# Patient Record
Sex: Female | Born: 1985
Health system: Southern US, Community
[De-identification: ages and names within clinical notes are randomized; demographics above are authoritative.]

## PROBLEM LIST (undated history)

## (undated) ENCOUNTER — Inpatient Hospital Stay (HOSPITAL_COMMUNITY): Payer: Self-pay

## (undated) DIAGNOSIS — A749 Chlamydial infection, unspecified: Secondary | ICD-10-CM

## (undated) DIAGNOSIS — Z9889 Other specified postprocedural states: Secondary | ICD-10-CM

## (undated) DIAGNOSIS — B373 Candidiasis of vulva and vagina: Secondary | ICD-10-CM

## (undated) DIAGNOSIS — N760 Acute vaginitis: Secondary | ICD-10-CM

## (undated) DIAGNOSIS — N83209 Unspecified ovarian cyst, unspecified side: Secondary | ICD-10-CM

## (undated) DIAGNOSIS — B9689 Other specified bacterial agents as the cause of diseases classified elsewhere: Secondary | ICD-10-CM

## (undated) DIAGNOSIS — B3731 Acute candidiasis of vulva and vagina: Secondary | ICD-10-CM

## (undated) DIAGNOSIS — N730 Acute parametritis and pelvic cellulitis: Secondary | ICD-10-CM

## (undated) DIAGNOSIS — R112 Nausea with vomiting, unspecified: Secondary | ICD-10-CM

## (undated) DIAGNOSIS — J45909 Unspecified asthma, uncomplicated: Secondary | ICD-10-CM

## (undated) HISTORY — DX: Unspecified asthma, uncomplicated: J45.909

## (undated) HISTORY — PX: WISDOM TOOTH EXTRACTION: SHX21

---
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
## 2005-10-16 ENCOUNTER — Emergency Department (HOSPITAL_COMMUNITY): Admission: EM | Admit: 2005-10-16 | Discharge: 2005-10-16 | Payer: Self-pay | Admitting: Emergency Medicine

## 2006-01-12 ENCOUNTER — Ambulatory Visit: Payer: Self-pay | Admitting: Obstetrics and Gynecology

## 2006-01-12 ENCOUNTER — Inpatient Hospital Stay (HOSPITAL_COMMUNITY): Admission: AD | Admit: 2006-01-12 | Discharge: 2006-01-12 | Payer: Self-pay | Admitting: Obstetrics and Gynecology

## 2006-03-03 ENCOUNTER — Inpatient Hospital Stay (HOSPITAL_COMMUNITY): Admission: AD | Admit: 2006-03-03 | Discharge: 2006-03-03 | Payer: Self-pay | Admitting: Obstetrics and Gynecology

## 2006-03-18 ENCOUNTER — Ambulatory Visit: Payer: Self-pay | Admitting: Obstetrics & Gynecology

## 2006-03-18 ENCOUNTER — Inpatient Hospital Stay (HOSPITAL_COMMUNITY): Admission: AD | Admit: 2006-03-18 | Discharge: 2006-03-18 | Payer: Self-pay | Admitting: Gynecology

## 2006-03-25 ENCOUNTER — Ambulatory Visit: Payer: Self-pay | Admitting: Obstetrics & Gynecology

## 2006-04-15 ENCOUNTER — Ambulatory Visit: Payer: Self-pay | Admitting: Obstetrics & Gynecology

## 2006-04-29 ENCOUNTER — Ambulatory Visit: Payer: Self-pay | Admitting: Obstetrics & Gynecology

## 2006-05-06 ENCOUNTER — Ambulatory Visit: Payer: Self-pay | Admitting: Obstetrics & Gynecology

## 2006-05-07 ENCOUNTER — Inpatient Hospital Stay (HOSPITAL_COMMUNITY): Admission: AD | Admit: 2006-05-07 | Discharge: 2006-05-07 | Payer: Self-pay | Admitting: Obstetrics and Gynecology

## 2006-05-13 ENCOUNTER — Ambulatory Visit: Payer: Self-pay | Admitting: Obstetrics & Gynecology

## 2006-05-20 ENCOUNTER — Ambulatory Visit: Payer: Self-pay | Admitting: Family Medicine

## 2006-06-03 ENCOUNTER — Ambulatory Visit: Payer: Self-pay | Admitting: *Deleted

## 2006-06-10 ENCOUNTER — Ambulatory Visit: Payer: Self-pay | Admitting: Obstetrics and Gynecology

## 2006-06-10 ENCOUNTER — Inpatient Hospital Stay (HOSPITAL_COMMUNITY): Admission: AD | Admit: 2006-06-10 | Discharge: 2006-06-13 | Payer: Self-pay | Admitting: Gynecology

## 2006-06-10 ENCOUNTER — Ambulatory Visit: Payer: Self-pay | Admitting: Obstetrics & Gynecology

## 2006-09-02 ENCOUNTER — Ambulatory Visit: Payer: Self-pay | Admitting: Obstetrics and Gynecology

## 2007-09-14 ENCOUNTER — Emergency Department (HOSPITAL_COMMUNITY): Admission: EM | Admit: 2007-09-14 | Discharge: 2007-09-14 | Payer: Self-pay | Admitting: Emergency Medicine

## 2007-11-04 ENCOUNTER — Emergency Department (HOSPITAL_COMMUNITY): Admission: EM | Admit: 2007-11-04 | Discharge: 2007-11-04 | Payer: Self-pay | Admitting: Emergency Medicine

## 2007-11-19 IMAGING — US US OB COMP LESS 14 WK
1 series · 14 of 28 positions shown · non-contrast
Comparison: none

CLINICAL DATA: Abdominal pain, positive pregnancy test.
 OBSTETRICAL ULTRASOUND <14 WKS AND TRANSVAGINAL OB US:
TECHNIQUE: Both transabdominal and transvaginal ultrasound examinations were performed for complete evaluation of the gestation as well as the maternal uterus, adnexal regions, and pelvic cul-de-sac.

[Series 1: unknown · 0.27mm/px · 14 of 63 slices shown]
[im 3/63]
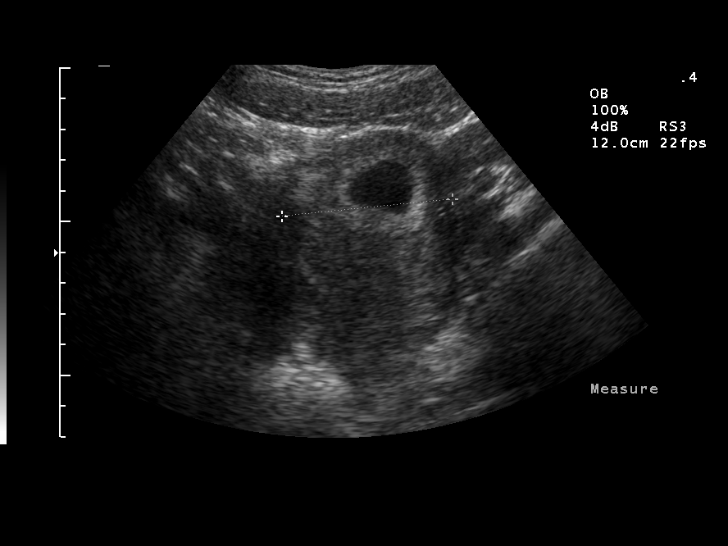
[im 7/63]
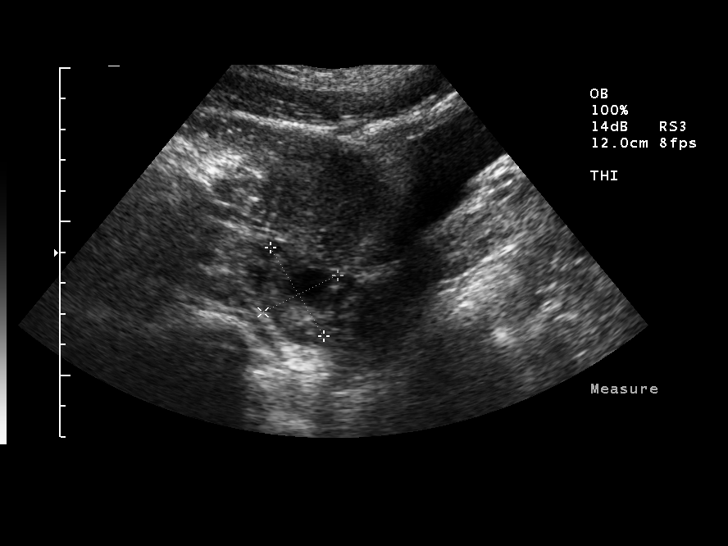
[im 12/63]
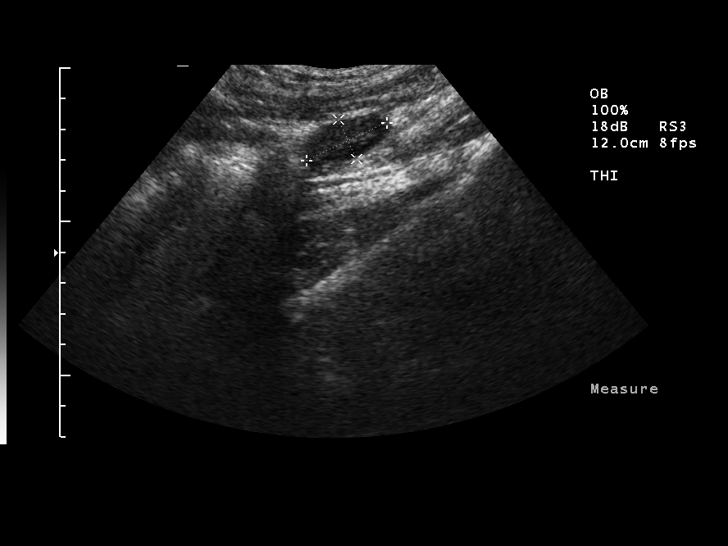
[im 17/63]
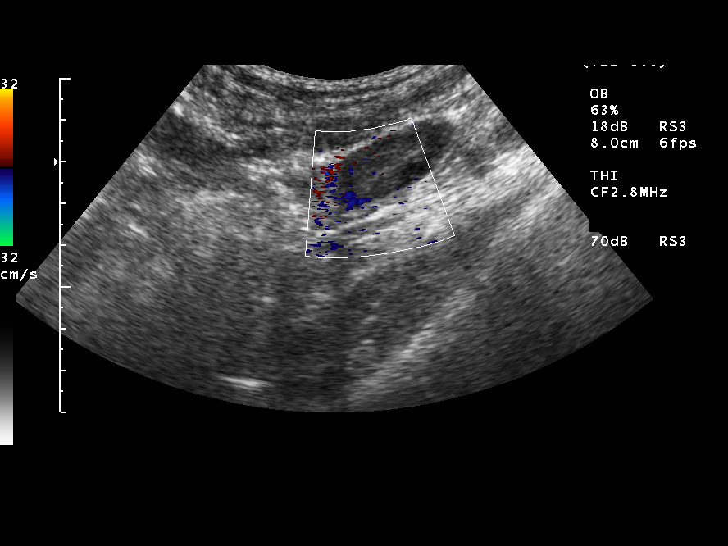
[im 21/63]
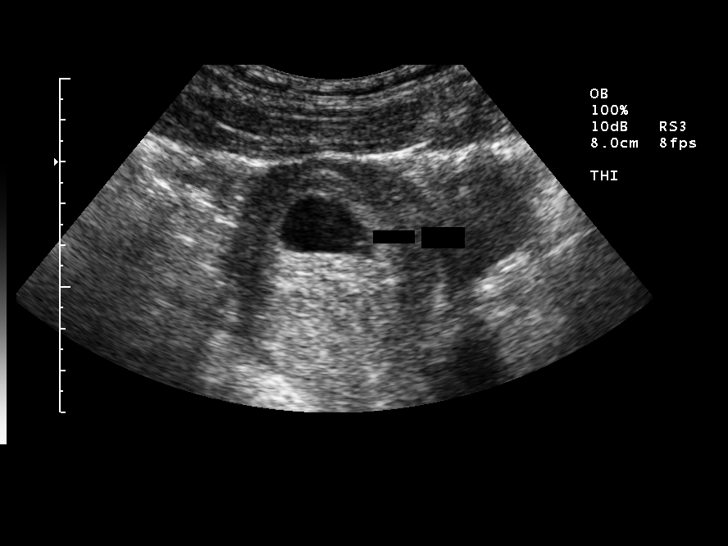
[im 26/63]
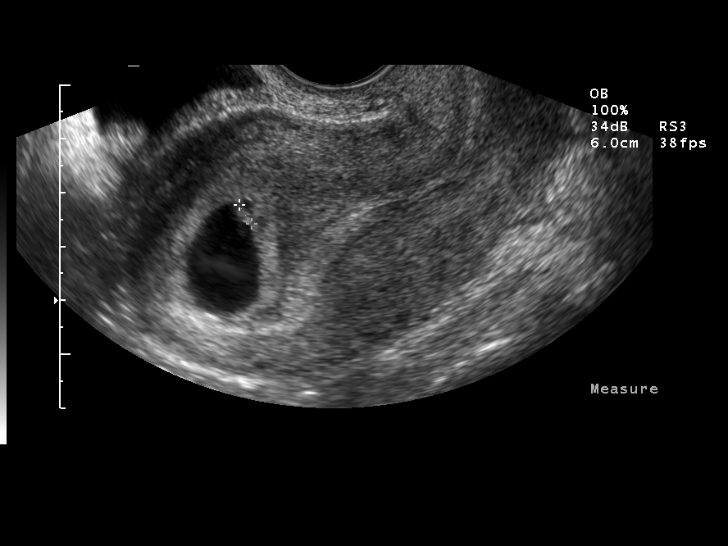
[im 30/63]
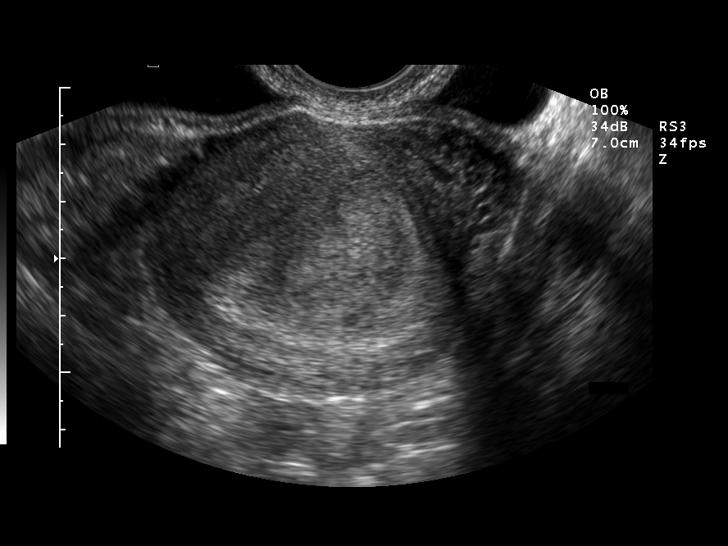
[im 35/63]
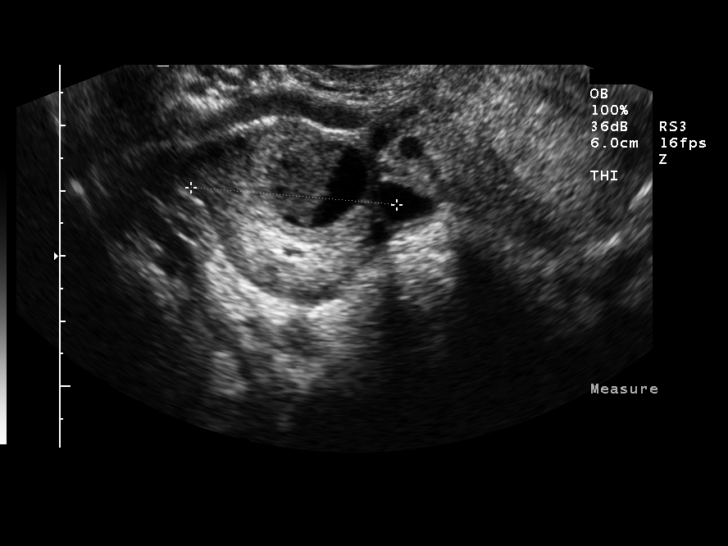
[im 40/63]
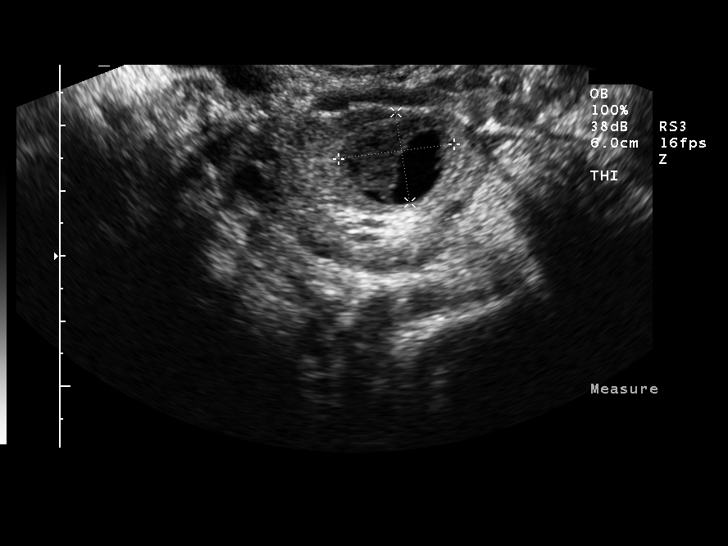
[im 44/63]
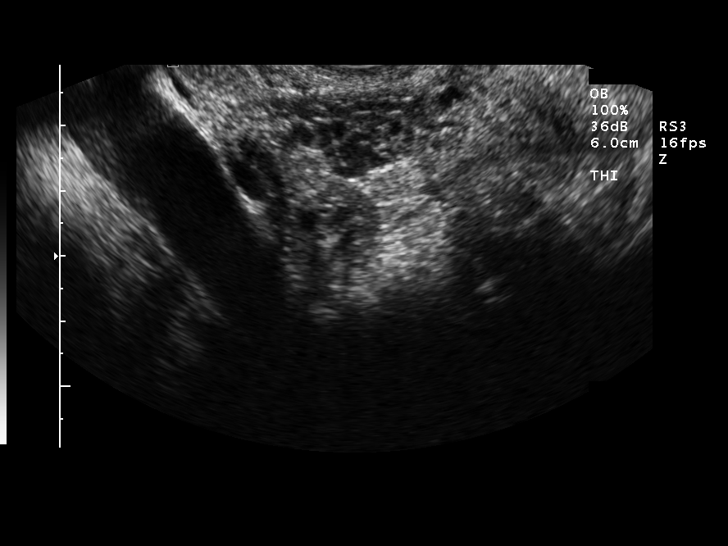
[im 49/63]
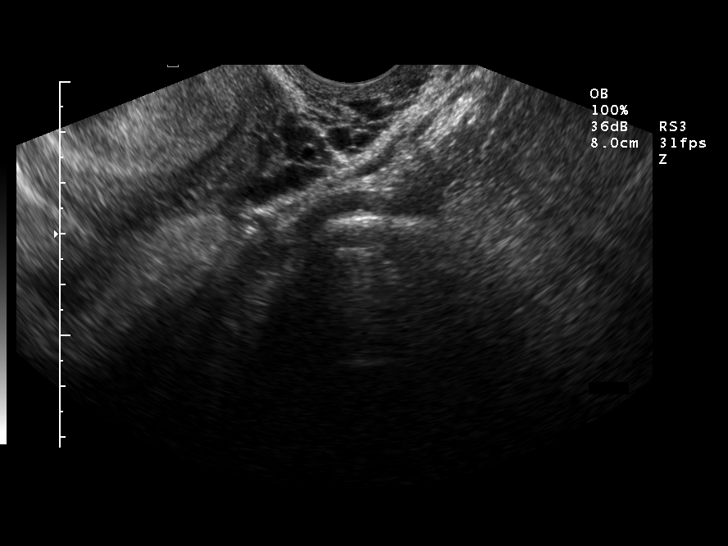
[im 53/63]
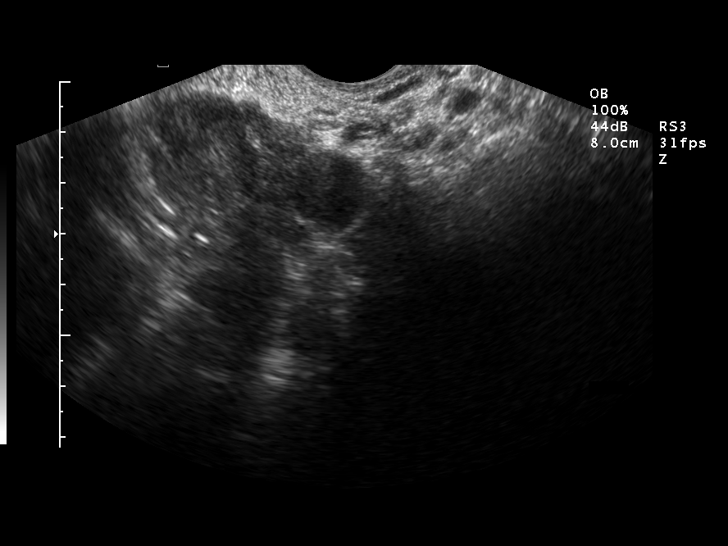
[im 58/63]
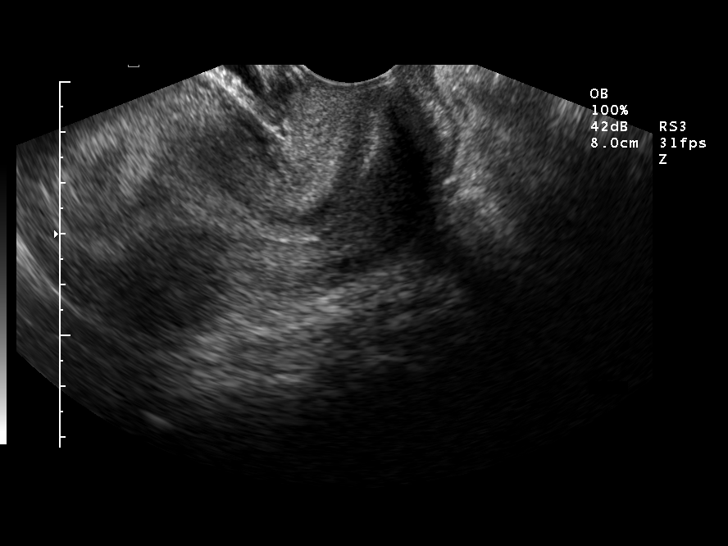
[im 63/63]
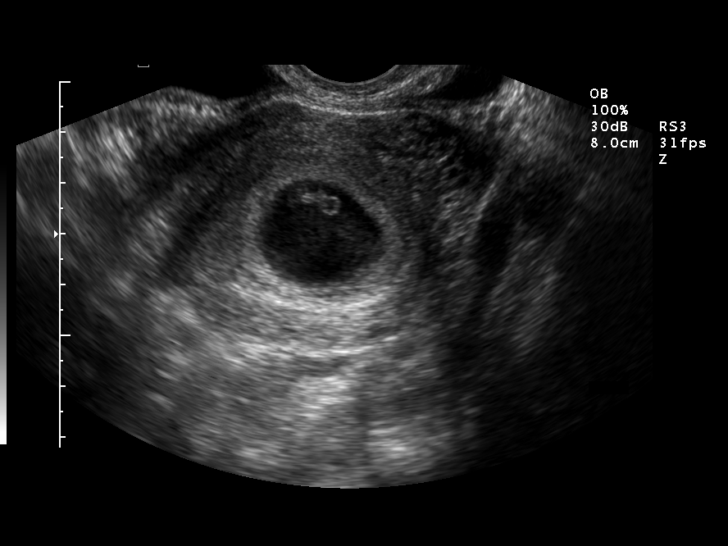

[14 of 28 positions shown; findings below may reference images not displayed]

FINDINGS: There is an intrauterine gestational sac with a yolk sac, fetal pole, and cardiac activity noted. Average ultrasound age by crown rump length of 5 mm is 6 weeks 2 days, yielding an estimated due date of 06/09/06.  
 The left ovary is normal.  There is a complex 1.8 x 1.6 x 1.4 cm right ovarian cyst with a fluid/debris level, likely a hemorrhagic corpus luteum.  No free fluid is seen.
IMPRESSION: 1.  Intrauterine gestational sac containing yolk sac, fetal pole, and cardiac activity noted.  Gestational age of 6 weeks 2 days by crown rump length yielding an EDC of 06/09/06.  This is 1 week behind the EDC by LMP which may be due to inaccuracy of the LMP.
 2.  Complex right ovarian cyst, likely a hemorrhagic corpus luteum.

## 2008-02-15 IMAGING — US US OB COMP +14 WK
1 series · 13 of 28 positions shown · non-contrast
Comparison: none

CLINICAL DATA: 19 weeks estimated gestational age with cramping.  No prenatal care since Aja.

[Series 1: us ob comp +14 wk · 0.29mm/px · 13 of 98 slices shown]
[im 4/98]
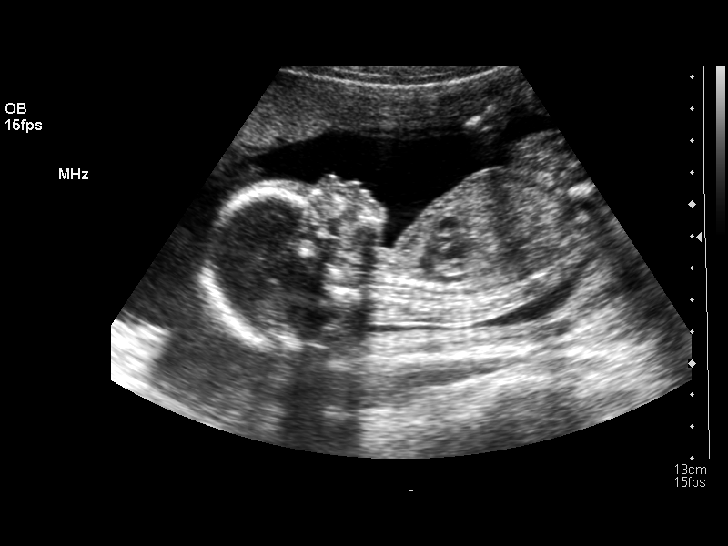
[im 11/98]
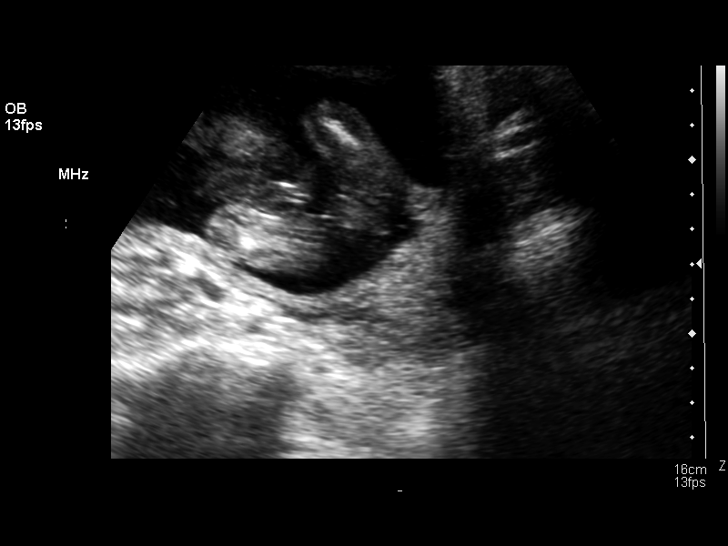
[im 18/98]
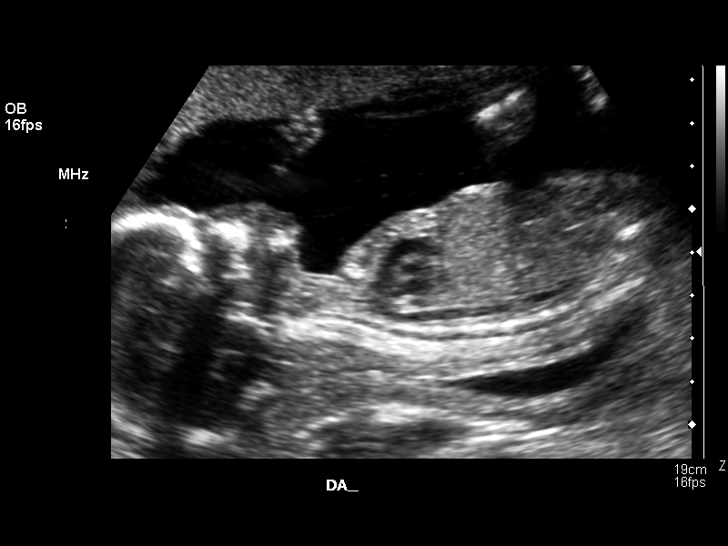
[im 26/98]
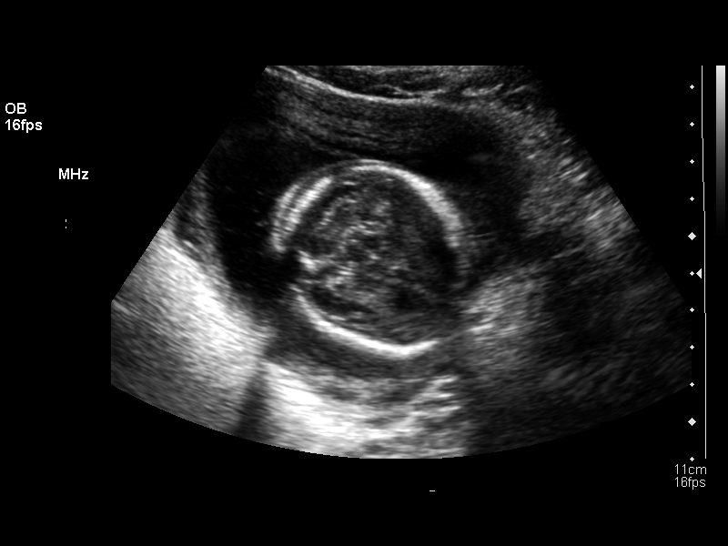
[im 33/98]
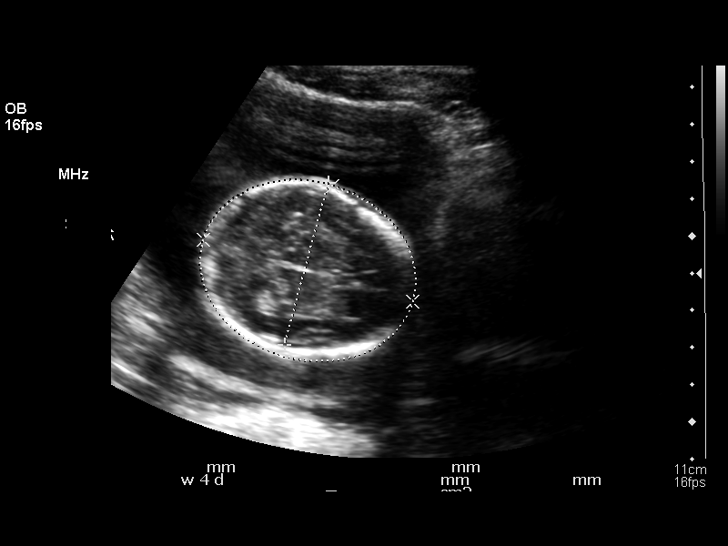
[im 40/98]
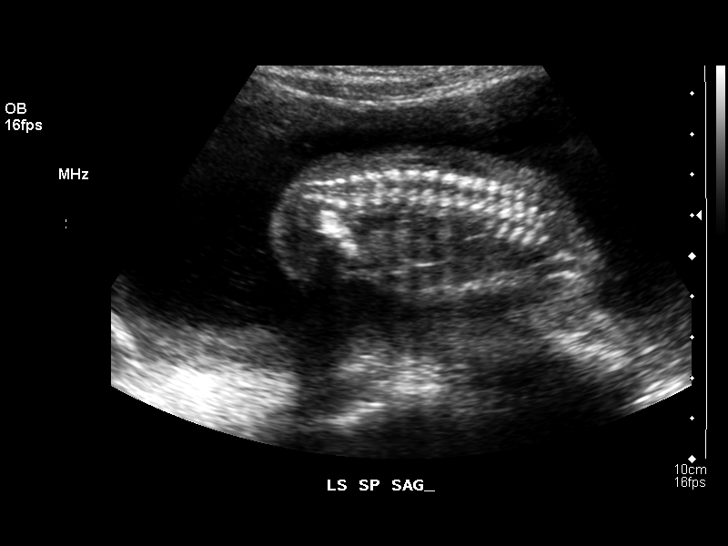
[im 51/98]
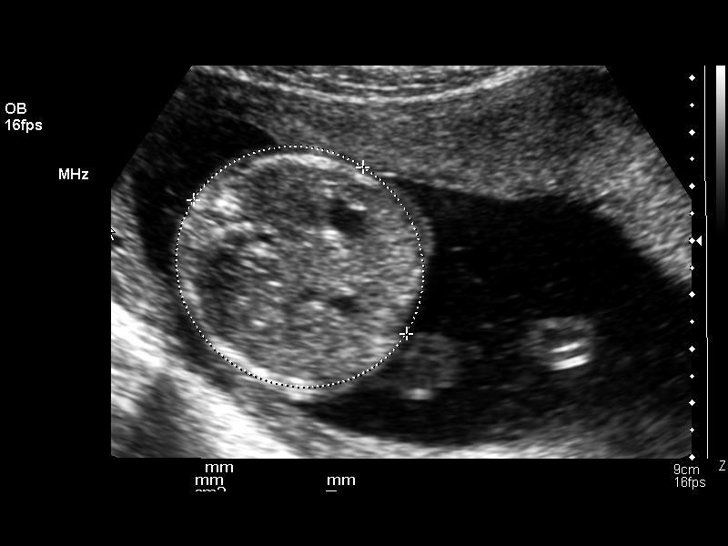
[im 58/98]
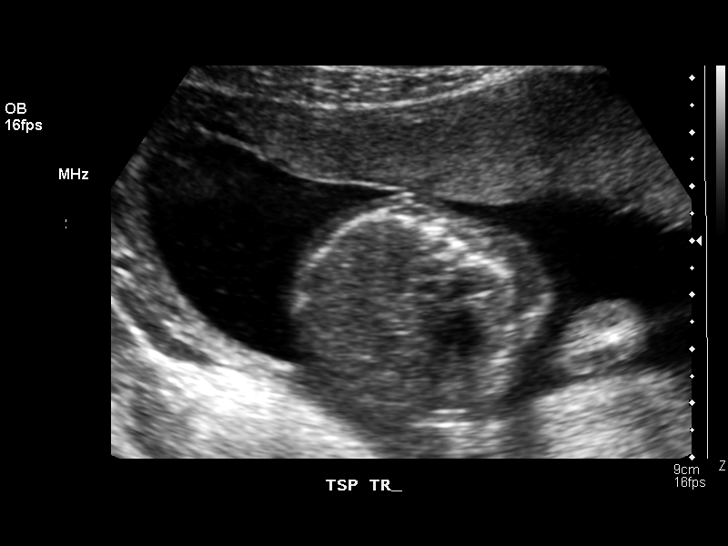
[im 65/98]
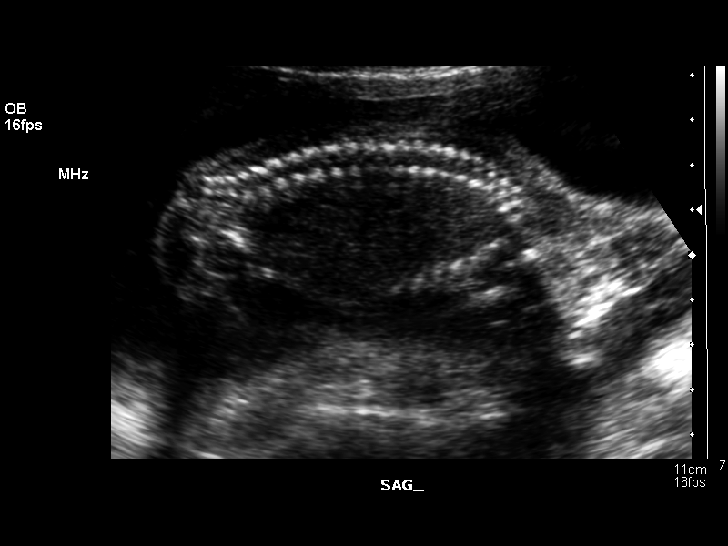
[im 72/98]
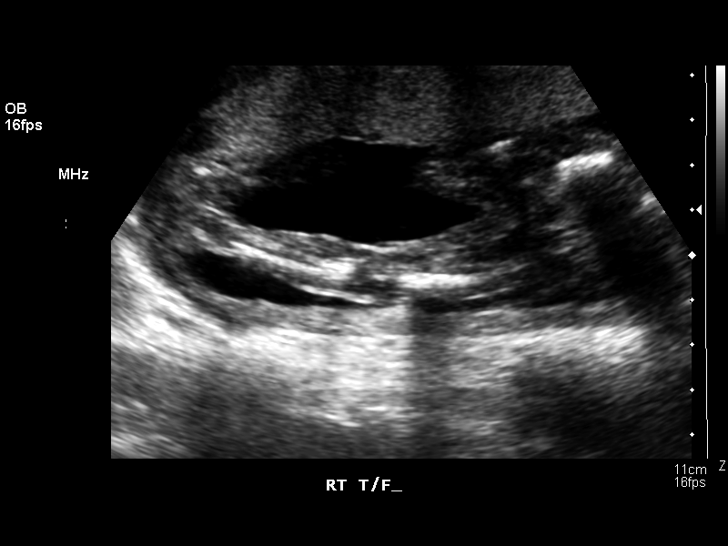
[im 80/98]
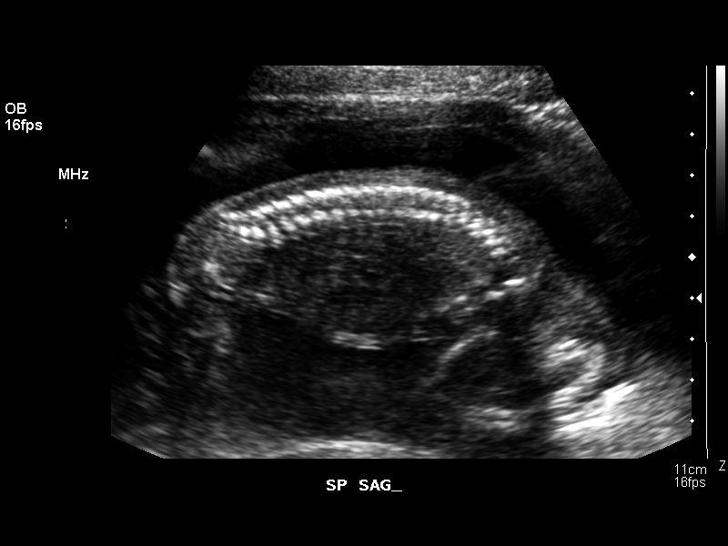
[im 87/98]
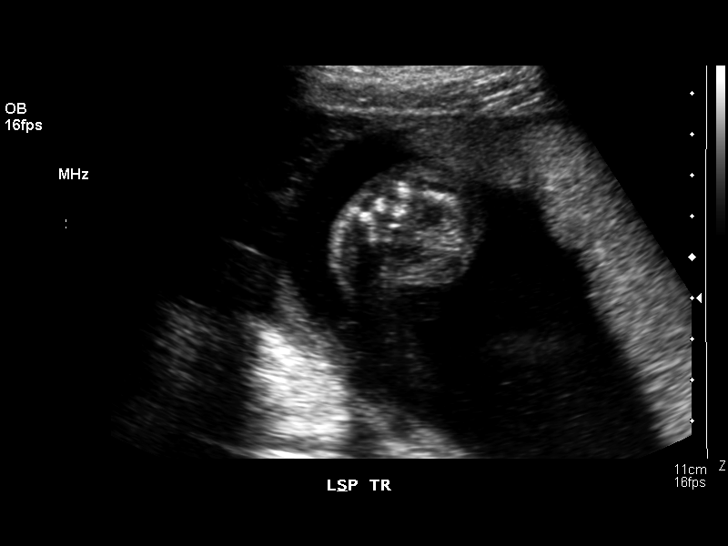
[im 94/98]
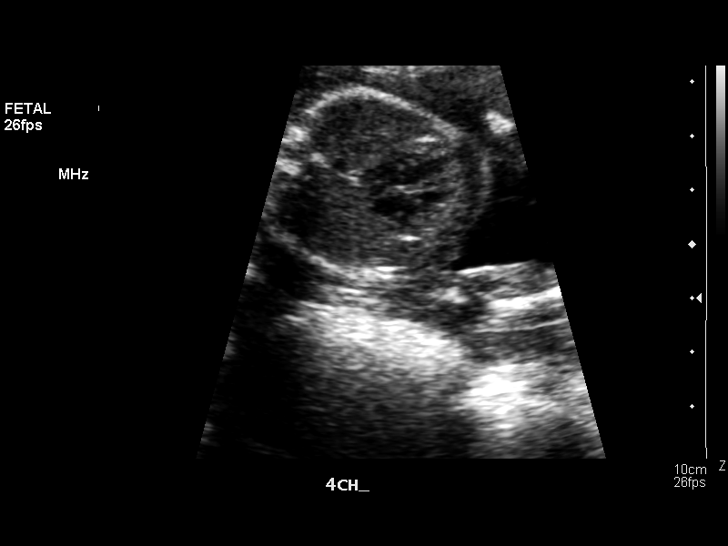

[13 of 28 positions shown; findings below may reference images not displayed]

OBSTETRICAL ULTRASOUND:
 Number of Fetuses:  1
 Heart Rate:  150
 Movement:  Yes
 Breathing:  No  
 Presentation:  Breech
 Placental Location: Anterior
 Grade:  I
 Previa:  No
 Amniotic Fluid (Subjective):  Normal
 Amniotic Fluid (Objective):   3.9 cm Vertical pocket 

 FETAL BIOMETRY
 BPD:   4.3 cm   19 w 4 d
 HC:   16.7 cm   19 w 3 d
 AC:   14.0 cm   19 w 2 d
 FL:    2.4 cm   19 w 1 d

 MEAN GA:  19 w 3 d  US EDC:  06/05/06

 FETAL ANATOMY
 Lateral Ventricles:    Visualized 
 Thalami/CSP:      Visualized 
 Posterior Fossa:  Visualized 
 Nuchal Region:  NF= 3.7 mm  Visualized 
 Spine:      Visualized 
 4 Chamber Heart on Left:      Visualized 
 Stomach on Left:      Visualized 
 3 Vessel Cord:    Visualized 
 Cord Insertion site:    Visualized 
 Kidneys:  Visualized 
 Bladder:  Visualized 
 Extremities:      Visualized 

 ADDITIONAL ANATOMY VISUALIZED:  LVOT, RVOT, upper lip, orbits, profile, diaphragm, both heels, 5th digit, ductal arch, aortic arch, male genitalia, and nasal bone.

 MATERNAL UTERINE AND ADNEXAL FINDINGS
 Cervix:   3.1 cm Transabdominally
IMPRESSION: 1.  Single intrauterine pregnancy demonstrating an estimated gestational age by ultrasound of 19 weeks and 3 days.  Correlation with expected estimated gestational age by initial ultrasound of 19 weeks and 3 days correlates with appropriate growth.  
 2.  No focal fetal or placental abnormalities are noted with a good anatomic exam possible. 
 3.  Subjectively and quantitatively normal amniotic fluid volume and normal cervical length.

## 2008-02-17 ENCOUNTER — Emergency Department (HOSPITAL_COMMUNITY): Admission: EM | Admit: 2008-02-17 | Discharge: 2008-02-17 | Payer: Self-pay | Admitting: Emergency Medicine

## 2008-02-19 ENCOUNTER — Emergency Department (HOSPITAL_COMMUNITY): Admission: EM | Admit: 2008-02-19 | Discharge: 2008-02-19 | Payer: Self-pay | Admitting: Emergency Medicine

## 2008-04-23 ENCOUNTER — Emergency Department (HOSPITAL_COMMUNITY): Admission: EM | Admit: 2008-04-23 | Discharge: 2008-04-23 | Payer: Self-pay | Admitting: Family Medicine

## 2008-10-05 ENCOUNTER — Emergency Department (HOSPITAL_COMMUNITY): Admission: EM | Admit: 2008-10-05 | Discharge: 2008-10-05 | Payer: Self-pay | Admitting: Emergency Medicine

## 2009-01-16 ENCOUNTER — Emergency Department (HOSPITAL_COMMUNITY): Admission: EM | Admit: 2009-01-16 | Discharge: 2009-01-16 | Payer: Self-pay | Admitting: Family Medicine

## 2009-02-23 ENCOUNTER — Emergency Department (HOSPITAL_COMMUNITY): Admission: EM | Admit: 2009-02-23 | Discharge: 2009-02-24 | Payer: Self-pay | Admitting: Emergency Medicine

## 2009-04-21 ENCOUNTER — Emergency Department (HOSPITAL_COMMUNITY): Admission: EM | Admit: 2009-04-21 | Discharge: 2009-04-22 | Payer: Self-pay | Admitting: Emergency Medicine

## 2009-04-22 ENCOUNTER — Emergency Department (HOSPITAL_COMMUNITY): Admission: EM | Admit: 2009-04-22 | Discharge: 2009-04-23 | Payer: Self-pay | Admitting: Emergency Medicine

## 2009-04-25 ENCOUNTER — Emergency Department (HOSPITAL_COMMUNITY): Admission: EM | Admit: 2009-04-25 | Discharge: 2009-04-25 | Payer: Self-pay | Admitting: Family Medicine

## 2009-12-03 ENCOUNTER — Emergency Department (HOSPITAL_COMMUNITY): Admission: EM | Admit: 2009-12-03 | Discharge: 2009-12-03 | Payer: Self-pay | Admitting: Emergency Medicine

## 2010-04-05 ENCOUNTER — Emergency Department (HOSPITAL_COMMUNITY): Admission: EM | Admit: 2010-04-05 | Discharge: 2010-04-05 | Payer: Self-pay | Admitting: Emergency Medicine

## 2010-09-16 LAB — POCT URINALYSIS DIP (DEVICE)
Bilirubin Urine: NEGATIVE
Glucose, UA: NEGATIVE mg/dL
Hgb urine dipstick: NEGATIVE
Ketones, ur: NEGATIVE mg/dL
Nitrite: NEGATIVE
Protein, ur: NEGATIVE mg/dL
Specific Gravity, Urine: 1.025 (ref 1.005–1.030)
Urobilinogen, UA: 0.2 mg/dL (ref 0.0–1.0)
pH: 6 (ref 5.0–8.0)

## 2010-09-16 LAB — WET PREP, GENITAL
Clue Cells Wet Prep HPF POC: NONE SEEN
Trich, Wet Prep: NONE SEEN
Yeast Wet Prep HPF POC: NONE SEEN

## 2010-09-16 LAB — POCT PREGNANCY, URINE: Preg Test, Ur: NEGATIVE

## 2010-10-03 LAB — POCT I-STAT, CHEM 8
HCT: 44 % (ref 36.0–46.0)
Hemoglobin: 15 g/dL (ref 12.0–15.0)
Potassium: 3.4 mEq/L — ABNORMAL LOW (ref 3.5–5.1)
Sodium: 139 mEq/L (ref 135–145)
TCO2: 24 mmol/L (ref 0–100)

## 2010-10-03 LAB — URINALYSIS, ROUTINE W REFLEX MICROSCOPIC
Bilirubin Urine: NEGATIVE
Ketones, ur: NEGATIVE mg/dL
Nitrite: NEGATIVE
Protein, ur: NEGATIVE mg/dL
Urobilinogen, UA: 0.2 mg/dL (ref 0.0–1.0)
pH: 6.5 (ref 5.0–8.0)

## 2010-10-03 LAB — POCT URINALYSIS DIP (DEVICE)
Glucose, UA: 250 mg/dL — AB
Nitrite: POSITIVE — AB
Protein, ur: 100 mg/dL — AB
Urobilinogen, UA: 1 mg/dL (ref 0.0–1.0)

## 2010-10-03 LAB — WET PREP, GENITAL
Clue Cells Wet Prep HPF POC: NONE SEEN
Trich, Wet Prep: NONE SEEN
Yeast Wet Prep HPF POC: NONE SEEN

## 2010-10-03 LAB — GC/CHLAMYDIA PROBE AMP, GENITAL: Chlamydia, DNA Probe: POSITIVE — AB

## 2010-10-05 LAB — DIFFERENTIAL
Basophils Absolute: 0.1 10*3/uL (ref 0.0–0.1)
Eosinophils Absolute: 0.1 10*3/uL (ref 0.0–0.7)
Lymphs Abs: 1.4 10*3/uL (ref 0.7–4.0)
Monocytes Absolute: 0.7 10*3/uL (ref 0.1–1.0)
Monocytes Relative: 6 % (ref 3–12)
Neutro Abs: 9.5 10*3/uL — ABNORMAL HIGH (ref 1.7–7.7)

## 2010-10-05 LAB — COMPREHENSIVE METABOLIC PANEL
ALT: 17 U/L (ref 0–35)
AST: 20 U/L (ref 0–37)
Albumin: 3.9 g/dL (ref 3.5–5.2)
Alkaline Phosphatase: 49 U/L (ref 39–117)
CO2: 25 mEq/L (ref 19–32)
Chloride: 104 mEq/L (ref 96–112)
GFR calc Af Amer: >60 mL/min (ref >60)
GFR calc non Af Amer: >60 mL/min (ref >60)
Potassium: 3.4 mEq/L — ABNORMAL LOW (ref 3.5–5.1)
Total Bilirubin: 1 mg/dL (ref 0.3–1.2)

## 2010-10-05 LAB — URINALYSIS, ROUTINE W REFLEX MICROSCOPIC
Bilirubin Urine: NEGATIVE
Hgb urine dipstick: NEGATIVE
Nitrite: NEGATIVE
Protein, ur: NEGATIVE mg/dL
Specific Gravity, Urine: 1.022 (ref 1.005–1.030)
Urobilinogen, UA: 1 mg/dL (ref 0.0–1.0)

## 2010-10-05 LAB — RPR: RPR Ser Ql: NONREACTIVE

## 2010-10-05 LAB — URINE MICROSCOPIC-ADD ON

## 2010-10-05 LAB — CBC
MCV: 69.3 fL — ABNORMAL LOW (ref 78.0–100.0)
Platelets: 289 10*3/uL (ref 150–400)
RBC: 5.53 MIL/uL — ABNORMAL HIGH (ref 3.87–5.11)
WBC: 11.8 10*3/uL — ABNORMAL HIGH (ref 4.0–10.5)

## 2010-10-05 LAB — WET PREP, GENITAL
Trich, Wet Prep: NONE SEEN
Yeast Wet Prep HPF POC: NONE SEEN

## 2010-10-09 LAB — POCT PREGNANCY, URINE: Preg Test, Ur: NEGATIVE

## 2011-04-28 ENCOUNTER — Emergency Department (HOSPITAL_COMMUNITY)
Admission: EM | Admit: 2011-04-28 | Discharge: 2011-04-28 | Disposition: A | Discharge status: Home / Self Care | Payer: Self-pay | Attending: Emergency Medicine | Admitting: Emergency Medicine

## 2011-04-28 DIAGNOSIS — A5901 Trichomonal vulvovaginitis: Secondary | ICD-10-CM | POA: Insufficient documentation

## 2011-04-28 DIAGNOSIS — B9689 Other specified bacterial agents as the cause of diseases classified elsewhere: Secondary | ICD-10-CM | POA: Insufficient documentation

## 2011-04-28 DIAGNOSIS — N760 Acute vaginitis: Secondary | ICD-10-CM | POA: Insufficient documentation

## 2011-04-28 DIAGNOSIS — A499 Bacterial infection, unspecified: Secondary | ICD-10-CM | POA: Insufficient documentation

## 2011-04-28 DIAGNOSIS — M549 Dorsalgia, unspecified: Secondary | ICD-10-CM | POA: Insufficient documentation

## 2011-04-28 LAB — URINALYSIS, ROUTINE W REFLEX MICROSCOPIC
Bilirubin Urine: NEGATIVE
Hgb urine dipstick: NEGATIVE
Ketones, ur: NEGATIVE mg/dL
Specific Gravity, Urine: 1.012 (ref 1.005–1.030)
Urobilinogen, UA: 0.2 mg/dL (ref 0.0–1.0)

## 2011-04-28 LAB — WET PREP, GENITAL

## 2011-04-28 LAB — POCT PREGNANCY, URINE: Preg Test, Ur: NEGATIVE

## 2011-04-29 LAB — GC/CHLAMYDIA PROBE AMP, GENITAL: Chlamydia, DNA Probe: NEGATIVE

## 2011-11-22 ENCOUNTER — Emergency Department (INDEPENDENT_AMBULATORY_CARE_PROVIDER_SITE_OTHER)
Admission: EM | Admit: 2011-11-22 | Discharge: 2011-11-22 | Disposition: A | Discharge status: Home / Self Care | Payer: Self-pay | Source: Home / Self Care | Attending: Emergency Medicine | Admitting: Emergency Medicine

## 2011-11-22 ENCOUNTER — Encounter (HOSPITAL_COMMUNITY): Payer: Self-pay | Admitting: Emergency Medicine

## 2011-11-22 DIAGNOSIS — R1013 Epigastric pain: Secondary | ICD-10-CM

## 2011-11-22 DIAGNOSIS — K3189 Other diseases of stomach and duodenum: Secondary | ICD-10-CM

## 2011-11-22 DIAGNOSIS — N949 Unspecified condition associated with female genital organs and menstrual cycle: Secondary | ICD-10-CM

## 2011-11-22 DIAGNOSIS — R102 Pelvic and perineal pain: Secondary | ICD-10-CM

## 2011-11-22 HISTORY — DX: Chlamydial infection, unspecified: A74.9

## 2011-11-22 HISTORY — DX: Acute parametritis and pelvic cellulitis: N73.0

## 2011-11-22 HISTORY — DX: Other specified bacterial agents as the cause of diseases classified elsewhere: N76.0

## 2011-11-22 HISTORY — DX: Other specified bacterial agents as the cause of diseases classified elsewhere: B96.89

## 2011-11-22 HISTORY — DX: Acute candidiasis of vulva and vagina: B37.31

## 2011-11-22 HISTORY — DX: Unspecified ovarian cyst, unspecified side: N83.209

## 2011-11-22 HISTORY — DX: Candidiasis of vulva and vagina: B37.3

## 2011-11-22 LAB — WET PREP, GENITAL
Clue Cells Wet Prep HPF POC: NONE SEEN
Trich, Wet Prep: NONE SEEN
Yeast Wet Prep HPF POC: NONE SEEN

## 2011-11-22 LAB — POCT I-STAT, CHEM 8
Calcium, Ion: 1.21 mmol/L (ref 1.12–1.32)
Glucose, Bld: 75 mg/dL (ref 70–99)
HCT: 42 % (ref 36.0–46.0)
Hemoglobin: 14.3 g/dL (ref 12.0–15.0)

## 2011-11-22 LAB — POCT URINALYSIS DIP (DEVICE)
Bilirubin Urine: NEGATIVE
Glucose, UA: NEGATIVE mg/dL
Hgb urine dipstick: NEGATIVE
Specific Gravity, Urine: 1.015 (ref 1.005–1.030)

## 2011-11-22 MED ORDER — OMEPRAZOLE 20 MG PO CPDR: 20.0000 mg | DELAYED_RELEASE_CAPSULE | Freq: Every day | ORAL | Status: DC

## 2011-11-22 MED ORDER — FAMOTIDINE 20 MG PO TABS: 20.0000 mg | ORAL_TABLET | Freq: Every day | ORAL | Status: DC

## 2011-11-22 MED ORDER — OMEPRAZOLE 20 MG PO CPDR: 20.0000 mg | DELAYED_RELEASE_CAPSULE | Freq: Two times a day (BID) | ORAL | Status: DC

## 2011-11-22 MED ORDER — FAMOTIDINE 20 MG PO TABS: 20.0000 mg | ORAL_TABLET | Freq: Two times a day (BID) | ORAL | Status: DC

## 2011-11-22 NOTE — Discharge Instructions (Signed)
You need an ultrasound to further characterize causing your symptoms, however, this can be done as an outpatient.. Return to the ED if you've a fever above 100.4, if you pain changes or gets worse, if you are unable to keep liquids down, or for any other concerns.  Go to www.goodrx.com to look up your medications. This will give you a list of where you can find your prescriptions at the most affordable prices.   Call [facility]  [phone]  If you have no primary doctor, here are some resources that may be helpful:  Medicaid-accepting [facility] Providers:   - [facility]- [address]      [phone]      Mon-Fri 9am-7pm, Sat 9am-1pm   - [facility]- [address] [place], [place]      [phone]   - [facility]- [address], [address] [place]      [phone]   [facility]- [address]      [phone]   - Renaye Rakers- [address] [place], [address]      [phone]      Only accepts [place] patients       after they have her name applied to their card   Self Pay (no insurance) in [place]:   - Sickle Cell Patients: Dr Willey Blade, [facility]      [address] [place]      [phone]   - [phone]   - Physician Referral Service- [phone]   - [facility]- [address] [place]      [phone]   [facility]- [address]   - [facility]- see information above      (Speak to [employer] if you do not have insurance)   - [facility]- [address] [place]      [phone]   - [facility] [place]- [address]      [phone]   - [facility]- [address]      [phone]   - Dr Julio Sicks-  [address], [place]      [phone]   - [facility]- [address]      [phone]   - [facility]- [address]      [phone]      Also [address]      [phone]   - [facility]- [address]      [phone]      1st and 3rd Saturday every month, 10am-1pm    Other agencies that provide inexpensive medical care:     [facility]  [phone]    [facility]  [phone]    [facility]  [phone]    [facility]  [phone] [address] [place] [address]    [facility]  [phone]    [facility]  [phone] [facility] [phone]   [address] [place] [address] [place], [place] [address]  Chronic Pain Problems Contact [facility]  [phone] Patients need to be referred by their primary care doctor.  [facility]     [employer]  [facility] Dept. [address] [place]                       [address]      [address] [place] [address]   [phone] (After Hours)  General Information: Finding a doctor when you do not have health insurance can be tricky. Although you are not limited by an insurance plan, you are of course limited by her finances and how much but he can pay out of pocket.  What are your options if you don't have health insurance?   1) Find a Librarian, academic and Pay Out of Pocket Although you won't have to find out who is covered by your insurance plan, it is a good idea to ask around and get recommendations. You will then need to call the office and see if the doctor you have chosen will accept you as a new patient and what types of options they offer for patients who are self-pay. Some doctors offer discounts or will set up payment plans for their patients who do not have insurance, but you will need to ask so you aren't surprised when you get to your appointment.  2) Contact Your Local Health Department Not all health departments have doctors that can see  patients for sick visits, but many do, so it is worth a call to see if yours does. If you don't know where your local health department is, you can check in your phone book. The CDC also has a tool to help you locate your state's health department, and many state websites also have listings of all of their local health departments.  3) Find a Walk-in Clinic If your illness is not likely to be very severe or complicated, you may want to try a walk in clinic. These are popping up all over the country in pharmacies, drugstores, and shopping centers. They're usually staffed by nurse practitioners or physician assistants that have been trained to treat common illnesses and complaints. They're usually fairly quick and inexpensive. However, if you have serious medical issues or chronic medical problems, these are probably not your best option

## 2011-11-22 NOTE — ED Provider Notes (Signed)
History     CSN: [id number]  Arrival date & time 11/22/11  1523   First MD Initiated Contact with Patient 11/22/11 1541      Chief Complaint  Patient presents with  . Abdominal Pain  . Pelvic Pain    (Consider location/radiation/quality/duration/timing/severity/associated sxs/prior treatment) HPI Comments: Patient presents with abdominal pain and pelvic pain. First, she reports constant left upper quadrant "fullness", and a sensation of being bloated for the past week. She she feels worse when eating, better with fasting. Pain is not affected with movement, urination, movement. Small amount of nausea but no vomiting. No anorexia. No abdominal distention, diarrhea, constipation, blood in her stool. Bowel movement this morning was normal. Patient denies NSAID use, medication use. She smokes occasionally. No history of PUD, GERD, devices, pancreatitis, alcohol abuse, IBS.  Second, she reports intermittent, cramping pelvic pain that alternates between the left and right side. This lasts minutes to hours. Is not affected with eating, urination, defecation, movement, intercourse. No aggravating or alleviating factors. She is not tried anything for this. No vaginal bleeding, discharge, urinary urgency, frequency, dysuria, back pain, cloudy  or oderous urine. She has had this pain on and off "for years", and was told that she might have an ovarian cyst at one point. She is sexually active with a female partner who is asymptomatic. STDs not a concern today. History of Chlamydia., BV, PID. No history of trichomoniasis, gonorrhea  ROS as noted in HPI. All other ROS negative. .  Patient is a 26 y.o. female presenting with abdominal pain. The history is provided by the patient. No language interpreter was used.  Abdominal Pain The primary symptoms of the illness include abdominal pain. The onset of the illness was gradual.  The illness is associated with eating. The patient states that she believes she is  currently not pregnant. The patient has not had a change in bowel habit. Symptoms associated with the illness do not include chills, anorexia, heartburn, constipation, urgency, hematuria, frequency or back pain. Significant associated medical issues do not include PUD, GERD, inflammatory bowel disease, diabetes, gallstones, liver disease, substance abuse or diverticulitis.    Past Medical History  Diagnosis Date  . BV (bacterial vaginosis)   . Chlamydia   . PID (acute pelvic inflammatory disease)   . Ovarian cyst   . Yeast vaginitis     History reviewed. No pertinent past surgical history.  History reviewed. No pertinent family history.  History  Substance Use Topics  . Smoking status: Current Some Day Smoker  . Smokeless tobacco: Not on file  . Alcohol Use: No    OB History    Grav Para Term Preterm Abortions TAB SAB Ect Mult Living   1 1 1       1       Review of Systems  Constitutional: Negative for chills.  Gastrointestinal: Positive for abdominal pain. Negative for heartburn, constipation and anorexia.  Genitourinary: Negative for urgency, frequency and hematuria.  Musculoskeletal: Negative for back pain.    Allergies  Penicillins  Home Medications   Current Outpatient Rx  Name Route Sig Dispense Refill  . FAMOTIDINE 20 MG PO TABS Oral Take 1 tablet (20 mg total) by mouth daily. 30 tablet 0  . OMEPRAZOLE 20 MG PO CPDR Oral Take 1 capsule (20 mg total) by mouth daily. 30 capsule 0    BP 136/89  Pulse 66  Temp(Src) 98.9 F (37.2 C) (Oral)  Resp 16  SpO2 100%  LMP 11/04/2011  BP  136/89. Entry error  Physical Exam  Nursing note and vitals reviewed. Constitutional: She is oriented to person, place, and time. She appears well-developed and well-nourished. No distress.  HENT:  Head: Normocephalic and atraumatic.  Eyes: EOM are normal.  Neck: Normal range of motion.  Cardiovascular: Normal rate, regular rhythm and normal heart sounds.   Pulmonary/Chest:  Effort normal and breath sounds normal.  Abdominal: Soft. Normal appearance and bowel sounds are normal. She exhibits no distension and no mass. There is no tenderness. There is no rebound, no guarding and no CVA tenderness.  Genitourinary: Uterus normal. Pelvic exam was performed with patient supine. There is no rash on the right labia. There is no rash on the left labia. Uterus is not tender. Cervix exhibits no motion tenderness and no friability. Right adnexum displays no mass, no tenderness and no fullness. Left adnexum displays tenderness. Left adnexum displays no mass and no fullness. No erythema, tenderness or bleeding around the vagina. No foreign body around the vagina. Vaginal discharge found.       Take white nonodorous  vaginal d/c. Mild left adnexal tenderness Chaperone present during exam  Musculoskeletal: Normal range of motion.  Neurological: She is alert and oriented to person, place, and time.  Skin: Skin is warm and dry.  Psychiatric: She has a normal mood and affect. Her behavior is normal. Judgment and thought content normal.    ED Course  Procedures (including critical care time)  Labs Reviewed  WET PREP, GENITAL - Abnormal; Notable for the following:    WBC, Wet Prep HPF POC RARE (*)    All other components within normal limits  POCT URINALYSIS DIP (DEVICE)  POCT PREGNANCY, URINE  POCT H PYLORI SCREEN  POCT I-STAT, CHEM 8  POCT H PYLORI SCREEN  GC/CHLAMYDIA PROBE AMP, GENITAL   No results found.   1. Dyspepsia   2. Pelvic pain     Results for orders placed during the hospital encounter of 11/22/11  POCT URINALYSIS DIP (DEVICE)      Component Value Range   Glucose, UA NEGATIVE  NEGATIVE (mg/dL)   Bilirubin Urine NEGATIVE  NEGATIVE    Ketones, ur NEGATIVE  NEGATIVE (mg/dL)   Specific Gravity, Urine 1.015  1.005 - 1.030    Hgb urine dipstick NEGATIVE  NEGATIVE    pH 6.0  5.0 - 8.0    Protein, ur NEGATIVE  NEGATIVE (mg/dL)   Urobilinogen, UA 0.2  0.0 - 1.0  (mg/dL)   Nitrite NEGATIVE  NEGATIVE    Leukocytes, UA NEGATIVE  NEGATIVE   POCT PREGNANCY, URINE      Component Value Range   Preg Test, Ur NEGATIVE  NEGATIVE   WET PREP, GENITAL      Component Value Range   Yeast Wet Prep HPF POC NONE SEEN  NONE SEEN    Trich, Wet Prep NONE SEEN  NONE SEEN    Clue Cells Wet Prep HPF POC NONE SEEN  NONE SEEN    WBC, Wet Prep HPF POC RARE (*) NONE SEEN   POCT I-STAT, CHEM 8      Component Value Range   Sodium 139  135 - 145 (mEq/L)   Potassium 3.7  3.5 - 5.1 (mEq/L)   Chloride 102  96 - 112 (mEq/L)   BUN 9  6 - 23 (mg/dL)   Creatinine, Ser 9.81  0.50 - 1.10 (mg/dL)   Glucose, Bld 75  70 - 99 (mg/dL)   Calcium, Ion 1.91  4.78 - 1.32 (mmol/L)  TCO2 26  0 - 100 (mmol/L)   Hemoglobin 14.3  12.0 - 15.0 (g/dL)   HCT 96.0  45.4 - 09.8 (%)  POCT H PYLORI SCREEN      Component Value Range   H. PYLORI SCREEN, POC NEGATIVE  NEGATIVE      MDM  Previous records reviewed. Was seen in the ER 04/28/11 for abdominal pain.  UA negative. Diagnosed with BV, back pain. Wet prep was positive for BV and WBCs. Negative for trichomoniasis, yeast, gonorrhea, Chlamydia.   Pt abd exam is benign, no peritoneal signs.  No anorexia, appears well. Vitals normal. No evidence of surgical abd. Doubt SBO, mesenteric ischemia, appendicitis, hepatitis, cholecystitis, pancreatitis, or perforated viscus. No evidence to support or suggest GYN pathology such as ovarian torsion or infection. H&P most consistent with an ovarian cyst. Patient is stable enough to have an outpatient ultrasound. Will Refer hert to  primary care resources.     Luiz Blare, MD 11/22/11 [phone]

## 2011-11-24 LAB — GC/CHLAMYDIA PROBE AMP, GENITAL: GC Probe Amp, Genital: NEGATIVE

## 2012-03-02 ENCOUNTER — Emergency Department (HOSPITAL_COMMUNITY)
Admission: EM | Admit: 2012-03-02 | Discharge: 2012-03-02 | Disposition: A | Discharge status: Home / Self Care | Payer: Self-pay | Source: Home / Self Care | Attending: Family Medicine | Admitting: Family Medicine

## 2012-03-02 ENCOUNTER — Encounter (HOSPITAL_COMMUNITY): Payer: Self-pay | Admitting: Emergency Medicine

## 2012-03-02 DIAGNOSIS — Z3490 Encounter for supervision of normal pregnancy, unspecified, unspecified trimester: Secondary | ICD-10-CM

## 2012-03-02 DIAGNOSIS — Z331 Pregnant state, incidental: Secondary | ICD-10-CM

## 2012-03-02 DIAGNOSIS — J069 Acute upper respiratory infection, unspecified: Secondary | ICD-10-CM

## 2012-03-02 NOTE — ED Provider Notes (Signed)
History     CSN: [id number]  Arrival date & time 03/02/12  1109   None     No chief complaint on file.   (Consider location/radiation/quality/duration/timing/severity/associated sxs/prior treatment) Patient is a 26 y.o. female presenting with URI. The history is provided by the patient. No language interpreter was used.  URI The primary symptoms include cough. The current episode started today. This is a new problem. The problem has been gradually worsening.  Symptoms associated with the illness include chills, congestion and rhinorrhea.  Pt also request pregnancy test.  Pt had positive home pregnancy test  Past Medical History  Diagnosis Date  . BV (bacterial vaginosis)   . Chlamydia   . PID (acute pelvic inflammatory disease)   . Ovarian cyst   . Yeast vaginitis     No past surgical history on file.  No family history on file.  History  Substance Use Topics  . Smoking status: Current Some Day Smoker  . Smokeless tobacco: Not on file  . Alcohol Use: No    OB History    Grav Para Term Preterm Abortions TAB SAB Ect Mult Living   1 1 1       1       Review of Systems  Constitutional: Positive for chills.  HENT: Positive for congestion and rhinorrhea.   Respiratory: Positive for cough.   All other systems reviewed and are negative.    Allergies  Penicillins  Home Medications   Current Outpatient Rx  Name Route Sig Dispense Refill  . FAMOTIDINE 20 MG PO TABS Oral Take 1 tablet (20 mg total) by mouth daily. 30 tablet 0  . OMEPRAZOLE 20 MG PO CPDR Oral Take 1 capsule (20 mg total) by mouth daily. 30 capsule 0    BP 131/81  Pulse 82  Temp 98.6 F (37 C) (Oral)  Resp 20  SpO2 100%  Physical Exam  Nursing note and vitals reviewed. Constitutional: She is oriented to person, place, and time. She appears well-developed and well-nourished.  HENT:  Head: Normocephalic and atraumatic.  Right Ear: External ear normal.  Left Ear: External ear normal.  Eyes:  Conjunctivae and EOM are normal. Pupils are equal, round, and reactive to light.  Neck: Normal range of motion. Neck supple.  Cardiovascular: Normal rate and normal heart sounds.   Pulmonary/Chest: Effort normal and breath sounds normal.  Abdominal: Soft.  Neurological: She is alert and oriented to person, place, and time.  Skin: Skin is warm.  Psychiatric: She has a normal mood and affect.    ED Course  Procedures (including critical care time)  Labs Reviewed - No data to display No results found.   No diagnosis found.    MDM  preg poitive,  I advised tylenol for uri.   Follow up for prenatal care        Lonia Skinner [place], [place] 03/02/12 1325

## 2012-03-02 NOTE — ED Notes (Signed)
C/o stuffy head and runny nose, feels nauseated, reports home preg test came back positive for pregnanct

## 2012-03-03 ENCOUNTER — Inpatient Hospital Stay (HOSPITAL_COMMUNITY)
Admission: AD | Admit: 2012-03-03 | Discharge: 2012-03-03 | Disposition: A | Discharge status: Home / Self Care | Payer: Medicaid Other | Source: Ambulatory Visit | Attending: Obstetrics & Gynecology | Admitting: Obstetrics & Gynecology

## 2012-03-03 ENCOUNTER — Encounter (HOSPITAL_COMMUNITY): Payer: Self-pay

## 2012-03-03 DIAGNOSIS — O210 Mild hyperemesis gravidarum: Secondary | ICD-10-CM | POA: Insufficient documentation

## 2012-03-03 DIAGNOSIS — O219 Vomiting of pregnancy, unspecified: Secondary | ICD-10-CM

## 2012-03-03 HISTORY — DX: Other specified postprocedural states: Z98.890

## 2012-03-03 HISTORY — DX: Nausea with vomiting, unspecified: R11.2

## 2012-03-03 LAB — URINALYSIS, ROUTINE W REFLEX MICROSCOPIC
Glucose, UA: NEGATIVE mg/dL
Hgb urine dipstick: NEGATIVE
Ketones, ur: 15 mg/dL — AB
Protein, ur: NEGATIVE mg/dL

## 2012-03-03 MED ORDER — PROMETHAZINE HCL 12.5 MG PO TABS: 12.5000 mg | ORAL_TABLET | Freq: Four times a day (QID) | ORAL | Status: DC | PRN

## 2012-03-03 MED ORDER — ONDANSETRON 8 MG PO TBDP
8.0000 mg | ORAL_TABLET | ORAL | Status: AC
  Administered 2012-03-03: 8 mg via ORAL
  Filled 2012-03-03: qty 1

## 2012-03-03 NOTE — MAU Provider Note (Signed)
History     CSN: [id number]  Arrival date and time: 03/03/12 1111   None     Chief Complaint  Patient presents with  . Morning Sickness   HPI Virginia Beltran is 26 y.o. G2P1001 [redacted]w[redacted]d weeks presenting with "not feeling well"--nausea in early pregnancy.   Denies  Vomiting. Was seen at Urgent Care yesterday for confirmation of pregnancy.   She is here for Rx for nausea medication.  She denies any other problems today.   She needs to be at an appointment at 1pm.      Past Medical History  Diagnosis Date  . BV (bacterial vaginosis)   . Chlamydia   . PID (acute pelvic inflammatory disease)   . Ovarian cyst   . Yeast vaginitis   . PONV (postoperative nausea and vomiting)     nausea    Past Surgical History  Procedure Date  . Wisdom tooth extraction     Family History  Problem Relation Age of Onset  . Other Neg Hx     History  Substance Use Topics  . Smoking status: Former Games developer  . Smokeless tobacco: Never Used  . Alcohol Use: No    Allergies:  Allergies  Allergen Reactions  . Penicillins     angiodema    Prescriptions prior to admission  Medication Sig Dispense Refill  . famotidine (PEPCID) 20 MG tablet Take 1 tablet (20 mg total) by mouth daily.  30 tablet  0  . omeprazole (PRILOSEC) 20 MG capsule Take 1 capsule (20 mg total) by mouth daily.  30 capsule  0    Review of Systems  Constitutional: Negative.   Respiratory: Negative.   Cardiovascular: Negative.   Gastrointestinal: Positive for nausea and vomiting.  Genitourinary: Negative.   Musculoskeletal: Negative.    Physical Exam   Blood pressure 112/69, pulse 68, temperature 98 F (36.7 C), temperature source Oral, resp. rate 16, height 5' 1.5" (1.562 m), weight 64.071 kg (141 lb 4 oz), last menstrual period 01/01/2012.  Physical Exam  Constitutional: She is oriented to person, place, and time. She appears well-developed and well-nourished. No distress.  HENT:  Head: Normocephalic.  GI:   Not examined  Genitourinary:       Not indicated  Neurological: She is alert and oriented to person, place, and time.  Skin: Skin is warm and dry.  Psychiatric: She has a normal mood and affect. Her behavior is normal.   Results for orders placed during the hospital encounter of 03/03/12 (from the past 24 hour(s))  URINALYSIS, ROUTINE W REFLEX MICROSCOPIC     Status: Abnormal   Collection Time   03/03/12 12:05 PM      Component Value Range   Color, Urine YELLOW  YELLOW   APPearance CLEAR  CLEAR   Specific Gravity, Urine 1.010  1.005 - 1.030   pH 6.0  5.0 - 8.0   Glucose, UA NEGATIVE  NEGATIVE mg/dL   Hgb urine dipstick NEGATIVE  NEGATIVE   Bilirubin Urine NEGATIVE  NEGATIVE   Ketones, ur 15 (*) NEGATIVE mg/dL   Protein, ur NEGATIVE  NEGATIVE mg/dL   Urobilinogen, UA 0.2  0.0 - 1.0 mg/dL   Nitrite NEGATIVE  NEGATIVE   Leukocytes, UA NEGATIVE  NEGATIVE   MAU Course  Procedures  MDM Order given for Zofran 8mg  ODT for here and a Rx for Phenergan for home.  Assessment and Plan  A:  Nausea in early pregnancy   P:  Rx for Phenergan 12.5mg  tabs 1  po q6-8hrs prn nausea     Instructed to begin prenatal care with the doctor of her choice.   Bonetta Mostek,EVE M 03/03/2012, 12:23 PM

## 2012-03-03 NOTE — MAU Note (Signed)
No adverse effect, nausea lessened.

## 2012-03-03 NOTE — MAU Note (Signed)
Pt states was seen at Urgent Care yesterday, confirmed preg. Denies abnormal vaginal discharge or bleeding. Here for constant nausea, no vomiting thus far.

## 2012-03-03 NOTE — ED Provider Notes (Signed)
Medical screening examination/treatment/procedure(s) were performed by resident physician or non-physician practitioner and as supervising physician I was immediately available for consultation/collaboration.   KINDL,JAMES DOUGLAS MD.    James D Kindl, MD 03/03/12 2137 

## 2012-03-07 ENCOUNTER — Inpatient Hospital Stay (HOSPITAL_COMMUNITY): Payer: Medicaid Other

## 2012-03-07 ENCOUNTER — Encounter (HOSPITAL_COMMUNITY): Payer: Self-pay | Admitting: *Deleted

## 2012-03-07 ENCOUNTER — Inpatient Hospital Stay (HOSPITAL_COMMUNITY)
Admission: AD | Admit: 2012-03-07 | Discharge: 2012-03-08 | Disposition: A | Discharge status: Hospice | Payer: Medicaid Other | Source: Ambulatory Visit | Attending: Obstetrics & Gynecology | Admitting: Obstetrics & Gynecology

## 2012-03-07 DIAGNOSIS — O210 Mild hyperemesis gravidarum: Secondary | ICD-10-CM | POA: Insufficient documentation

## 2012-03-07 LAB — URINE MICROSCOPIC-ADD ON

## 2012-03-07 LAB — CBC WITH DIFFERENTIAL/PLATELET
Basophils Relative: 0 % (ref 0–1)
Eosinophils Absolute: 0 10*3/uL (ref 0.0–0.7)
Eosinophils Relative: 0 % (ref 0–5)
MCH: 22 pg — ABNORMAL LOW (ref 26.0–34.0)
MCHC: 32.4 g/dL (ref 30.0–36.0)
MCV: 67.9 fL — ABNORMAL LOW (ref 78.0–100.0)
Neutrophils Relative %: 70 % (ref 43–77)
Platelets: 298 10*3/uL (ref 150–400)

## 2012-03-07 LAB — COMPREHENSIVE METABOLIC PANEL
ALT: 8 U/L (ref 0–35)
Alkaline Phosphatase: 51 U/L (ref 39–117)
CO2: 24 mEq/L (ref 19–32)
Chloride: 97 mEq/L (ref 96–112)
GFR calc Af Amer: >90 mL/min (ref >90)
GFR calc non Af Amer: >90 mL/min (ref >90)
Glucose, Bld: 77 mg/dL (ref 70–99)
Potassium: 3.6 mEq/L (ref 3.5–5.1)
Sodium: 133 mEq/L — ABNORMAL LOW (ref 135–145)
Total Bilirubin: 0.5 mg/dL (ref 0.3–1.2)

## 2012-03-07 LAB — URINALYSIS, ROUTINE W REFLEX MICROSCOPIC
Glucose, UA: NEGATIVE mg/dL
Ketones, ur: 40 mg/dL — AB
Leukocytes, UA: NEGATIVE
pH: 6 (ref 5.0–8.0)

## 2012-03-07 MED ORDER — LACTATED RINGERS IV BOLUS (SEPSIS)
1000.0000 mL | Freq: Once | INTRAVENOUS | Status: AC
Start: 2012-03-07 — End: 2012-03-07
  Administered 2012-03-07: 1000 mL via INTRAVENOUS

## 2012-03-07 MED ORDER — ONDANSETRON HCL 4 MG/2ML IJ SOLN
4.0000 mg | Freq: Once | INTRAMUSCULAR | Status: AC
  Administered 2012-03-07: 4 mg via INTRAVENOUS
  Filled 2012-03-07: qty 2

## 2012-03-07 NOTE — MAU Note (Signed)
Pt states she has been taking pherergan-does not help much-ran out today

## 2012-03-07 NOTE — MAU Provider Note (Signed)
History     CSN: [id number]  Arrival date and time: 03/07/12 2007   None     Chief Complaint  Patient presents with  . Hyperemesis Gravidarum   HPI Virginia Beltran is a 26 y.o. female @ [redacted]w[redacted]d gestation who presents to MAU with nausea and vomiting. Patient reports that she has not kept anything down in 2 days. She was evaluated here 03/03/12 with nausea and given Zofran 8 mg. ODT x 1 and Rx for Phenergan tablets. She continues to vomit despite the medication. The history was provided by the patient and her medical record.  OB History    Grav Para Term Preterm Abortions TAB SAB Ect Mult Living   2 1 1       1       Past Medical History  Diagnosis Date  . BV (bacterial vaginosis)   . Chlamydia   . PID (acute pelvic inflammatory disease)   . Ovarian cyst   . Yeast vaginitis   . PONV (postoperative nausea and vomiting)     nausea    Past Surgical History  Procedure Date  . Wisdom tooth extraction     Family History  Problem Relation Age of Onset  . Other Neg Hx     History  Substance Use Topics  . Smoking status: Former Smoker -- 1 years    Types: Cigarettes    Quit date: 03/02/2012  . Smokeless tobacco: Never Used  . Alcohol Use: Yes     past drink in July, not since.    Allergies:  Allergies  Allergen Reactions  . Penicillins     angiodema  . Shellfish Allergy Hives    Prescriptions prior to admission  Medication Sig Dispense Refill  . promethazine (PHENERGAN) 12.5 MG tablet Take 1 tablet (12.5 mg total) by mouth every 6 (six) hours as needed for nausea.  30 tablet  0    Review of Systems  Constitutional: Positive for weight loss. Negative for fever and chills.  HENT: Negative for ear pain, nosebleeds, congestion, sore throat and neck pain.   Eyes: Negative for blurred vision, double vision, photophobia and pain.  Respiratory: Negative for cough, shortness of breath and wheezing.   Cardiovascular: Negative for chest pain, palpitations and leg  swelling.  Gastrointestinal: Positive for nausea, vomiting and abdominal pain (epigastric). Negative for heartburn, diarrhea and constipation.  Genitourinary: Negative for dysuria, urgency and frequency.       Vaginal discharge, no bleeding  Musculoskeletal: Positive for back pain. Negative for myalgias.  Skin: Negative for itching and rash.  Neurological: Negative for dizziness, sensory change, speech change, seizures, weakness and headaches.  Endo/Heme/Allergies: Does not bruise/bleed easily.  Psychiatric/Behavioral: Negative for depression. The patient is not nervous/anxious and does not have insomnia.    Physical Exam   Blood pressure 110/64, pulse 88, temperature 98.3 F (36.8 C), temperature source Oral, resp. rate 20, height 5' 1.5" (1.562 m), weight 137 lb (62.143 kg), last menstrual period 01/01/2012, SpO2 99.00%.  Physical Exam  Nursing note and vitals reviewed. Constitutional: She is oriented to person, place, and time. She appears well-developed and well-nourished. No distress.  HENT:  Head: Normocephalic and atraumatic.  Eyes: EOM are normal.  Neck: Neck supple.  Cardiovascular: Normal rate.   Respiratory: Effort normal.  GI: Soft. There is no tenderness.  Genitourinary:       External genitalia without lesions. White discharge vaginal vault. Cervix long, closed, no CMT, no adnexal tenderness. Uterus without palpable enlargement.  Musculoskeletal: Normal  range of motion.  Neurological: She is alert and oriented to person, place, and time.  Skin: Skin is warm and dry.  Psychiatric: She has a normal mood and affect. Her behavior is normal. Judgment and thought content normal.   Results for orders placed during the hospital encounter of 03/07/12 (from the past 24 hour(s))  URINALYSIS, ROUTINE W REFLEX MICROSCOPIC     Status: Abnormal   Collection Time   03/07/12  8:30 PM      Component Value Range   Color, Urine YELLOW  YELLOW   APPearance CLEAR  CLEAR   Specific  Gravity, Urine >1.030 (*) 1.005 - 1.030   pH 6.0  5.0 - 8.0   Glucose, UA NEGATIVE  NEGATIVE mg/dL   Hgb urine dipstick NEGATIVE  NEGATIVE   Bilirubin Urine SMALL (*) NEGATIVE   Ketones, ur 40 (*) NEGATIVE mg/dL   Protein, ur 30 (*) NEGATIVE mg/dL   Urobilinogen, UA 1.0  0.0 - 1.0 mg/dL   Nitrite NEGATIVE  NEGATIVE   Leukocytes, UA NEGATIVE  NEGATIVE  URINE MICROSCOPIC-ADD ON     Status: Abnormal   Collection Time   03/07/12  8:30 PM      Component Value Range   Squamous Epithelial / LPF FEW (*) RARE   WBC, UA 3-6  <3 WBC/hpf   RBC / HPF 0-2  <3 RBC/hpf   Bacteria, UA FEW (*) RARE  COMPREHENSIVE METABOLIC PANEL     Status: Abnormal   Collection Time   03/07/12  9:30 PM      Component Value Range   Sodium 133 (*) 135 - 145 mEq/L   Potassium 3.6  3.5 - 5.1 mEq/L   Chloride 97  96 - 112 mEq/L   CO2 24  19 - 32 mEq/L   Glucose, Bld 77  70 - 99 mg/dL   BUN 6  6 - 23 mg/dL   Creatinine, Ser 9.56  0.50 - 1.10 mg/dL   Calcium 21.3  8.4 - 08.6 mg/dL   Total Protein 7.9  6.0 - 8.3 g/dL   Albumin 4.4  3.5 - 5.2 g/dL   AST 13  0 - 37 U/L   ALT 8  0 - 35 U/L   Alkaline Phosphatase 51  39 - 117 U/L   Total Bilirubin 0.5  0.3 - 1.2 mg/dL   GFR calc non Af Amer >90  >90 mL/min   GFR calc Af Amer >90  >90 mL/min  ABO/RH     Status: Normal   Collection Time   03/07/12  9:30 PM      Component Value Range   ABO/RH(D) A NEG    CBC WITH DIFFERENTIAL     Status: Abnormal   Collection Time   03/07/12  9:30 PM      Component Value Range   WBC 10.4  4.0 - 10.5 K/uL   RBC 5.64 (*) 3.87 - 5.11 MIL/uL   Hemoglobin 12.4  12.0 - 15.0 g/dL   HCT 57.8  46.9 - 62.9 %   MCV 67.9 (*) 78.0 - 100.0 fL   MCH 22.0 (*) 26.0 - 34.0 pg   MCHC 32.4  30.0 - 36.0 g/dL   RDW 52.8  41.3 - 24.4 %   Platelets 298  150 - 400 K/uL   Neutrophils Relative 70  43 - 77 %   Neutro Abs 7.3  1.7 - 7.7 K/uL   Lymphocytes Relative 20  12 - 46 %   Lymphs Abs 2.1  0.7 - 4.0 K/uL   Monocytes Relative 10  3 - 12 %   Monocytes  Absolute 1.0  0.1 - 1.0 K/uL   Eosinophils Relative 0  0 - 5 %   Eosinophils Absolute 0.0  0.0 - 0.7 K/uL   Basophils Relative 0  0 - 1 %   Basophils Absolute 0.0  0.0 - 0.1 K/uL   IV hydration, Zofran 4 mg IV MAU Course  ProceduresUs Ob Comp Less 14 Wks  03/08/2012  *RADIOLOGY REPORT*  Clinical Data: Pelvic pain.  9 weeks of 3 days pregnant by last menstrual period.  OBSTETRIC <14 WK ULTRASOUND  Technique:  Transabdominal ultrasound was performed for evaluation of the gestation as well as the maternal uterus and adnexal regions.  Comparison:  Previous examinations, the most recent dated 01/12/2006.  Intrauterine gestational sac: Visualized/normal in shape. Yolk sac: Suboptimally visualized due to the lack of transvaginal technique.  Suggestion of a small yolk sac. Embryo: Visualized Cardiac Activity: Visualized Heart Rate: 110 bpm  CRL:  7.1 mm  6 w  5 d        [place] EDC: 10/26/2012  Maternal uterus/Adnexae: Normal appearing maternal ovaries with a corpus luteum on the right.  Small subchorionic hemorrhage.  No free peritoneal fluid.  IMPRESSION:  1.  Single live intrauterine gestation with an estimated gestational age by today's measurements of 6 weeks and 5 days. 2.  Small subchorionic hemorrhage.   Original Report Authenticated By: Darrol Angel, M.D.    00:15 am patient returned from ultrasound and states she feels much better. No nausea at this timel  Assessment: 26 y.o. female @ 6 weeks and 5 days gestation with hyperemesis  Plan:  Add suppositories to use when can't keep down pills   GC, Chlamydia cultures pending Discussed with the patient and all questioned fully answered. She will follow up at [facility] or return here if any problems arise.  Follow-up Information    Follow up with [facility] [place].   Contact information:   [address] [facility] [address]         Kerrie Buffalo, [place], FNP, [facility] 03/08/2012, 12:30 AM

## 2012-03-07 NOTE — MAU Note (Signed)
Pt unable to keep any food or drink down since Friday.  Denies any diarrhea or dysuria however, states she has a clumpy, white discharge that is burning and irritating to the labia.

## 2012-03-08 LAB — WET PREP, GENITAL: Yeast Wet Prep HPF POC: NONE SEEN

## 2012-03-08 MED ORDER — PROMETHAZINE HCL 25 MG RE SUPP: 25.0000 mg | Freq: Four times a day (QID) | RECTAL | Status: DC | PRN

## 2012-03-08 NOTE — MAU Provider Note (Signed)
Attestation of Attending Supervision of Advanced Practitioner (CNM/NP): Evaluation and management procedures were performed by the Advanced Practitioner under my supervision and collaboration.  I have reviewed the Advanced Practitioner's note and chart, and I agree with the management and plan.  HARRAWAY-SMITH, Cynthia Stainback 2:50 AM    

## 2012-03-10 ENCOUNTER — Inpatient Hospital Stay (HOSPITAL_COMMUNITY)
Admission: AD | Admit: 2012-03-10 | Discharge: 2012-03-10 | Disposition: A | Discharge status: Home / Self Care | Payer: Medicaid Other | Source: Ambulatory Visit | Attending: Family Medicine | Admitting: Family Medicine

## 2012-03-10 ENCOUNTER — Encounter (HOSPITAL_COMMUNITY): Payer: Self-pay

## 2012-03-10 DIAGNOSIS — O210 Mild hyperemesis gravidarum: Secondary | ICD-10-CM

## 2012-03-10 LAB — URINALYSIS, ROUTINE W REFLEX MICROSCOPIC
Ketones, ur: 40 mg/dL — AB
Leukocytes, UA: NEGATIVE
Nitrite: NEGATIVE
Urobilinogen, UA: 1 mg/dL (ref 0.0–1.0)
pH: 6 (ref 5.0–8.0)

## 2012-03-10 MED ORDER — PROMETHAZINE HCL 25 MG/ML IJ SOLN
25.0000 mg | Freq: Once | INTRAVENOUS | Status: AC
  Administered 2012-03-10: 25 mg via INTRAVENOUS
  Filled 2012-03-10: qty 1

## 2012-03-10 NOTE — MAU Note (Signed)
Patient states she has been having nausea and vomiting everything she eats and drinks. Phenergan is no longer working. Denies any pain or bleeding.

## 2012-03-10 NOTE — MAU Provider Note (Signed)
History     CSN: [id number]  Arrival date and time: 03/10/12 1537   First Provider Initiated Contact with Patient 03/10/12 1626      Chief Complaint  Patient presents with  . Emesis During Pregnancy   HPI Virginia Beltran 26 y.o. [redacted]w[redacted]d   Comes to MAU with vomiting today.  Has not been able to keep down food or fluids today.  Was seen earlier this week (03-07-12) for the same problem and got IVF.  Has not been able to get phenergan suppositories due to money.  Last phenergan tablet she took was at 3 am.  Was vomiting again at 7 am.    OB History    Grav Para Term Preterm Abortions TAB SAB Ect Mult Living   2 1 1       1       Past Medical History  Diagnosis Date  . BV (bacterial vaginosis)   . Chlamydia   . PID (acute pelvic inflammatory disease)   . Ovarian cyst   . Yeast vaginitis   . PONV (postoperative nausea and vomiting)     nausea    Past Surgical History  Procedure Date  . Wisdom tooth extraction     Family History  Problem Relation Age of Onset  . Other Neg Hx     History  Substance Use Topics  . Smoking status: Former Smoker -- 1 years    Types: Cigarettes    Quit date: 03/02/2012  . Smokeless tobacco: Never Used  . Alcohol Use: Yes     past drink in July, not since.    Allergies:  Allergies  Allergen Reactions  . Penicillins     angiodema  . Shellfish Allergy Hives    Prescriptions prior to admission  Medication Sig Dispense Refill  . promethazine (PHENERGAN) 12.5 MG tablet Take 1 tablet (12.5 mg total) by mouth every 6 (six) hours as needed for nausea.  30 tablet  0  . promethazine (PHENERGAN) 25 MG suppository Place 1 suppository (25 mg total) rectally every 6 (six) hours as needed for nausea.  12 each  0    Review of Systems  Constitutional: Negative for fever.  Gastrointestinal: Positive for nausea and vomiting. Negative for abdominal pain, diarrhea and constipation.  Genitourinary: Negative for dysuria.   Physical Exam   Blood  pressure 110/65, pulse 86, temperature 99 F (37.2 C), temperature source Oral, resp. rate 16, height 5' 0.5" (1.537 m), weight 61.598 kg (135 lb 12.8 oz), last menstrual period 01/01/2012, SpO2 100.00%.  Physical Exam  Nursing note and vitals reviewed. Constitutional: She is oriented to person, place, and time. She appears well-developed and well-nourished. No distress.  HENT:  Head: Normocephalic.  Eyes: EOM are normal.  Neck: Neck supple.  Musculoskeletal: Normal range of motion.  Neurological: She is alert and oriented to person, place, and time.  Skin: Skin is warm and dry.  Psychiatric: She has a normal mood and affect.    MAU Course  Procedures Will give IVF of 1000cc LR with Phenergan 25 mg for rehydration and for vomiting.  MDM Results for orders placed during the hospital encounter of 03/10/12 (from the past 24 hour(s))  URINALYSIS, ROUTINE W REFLEX MICROSCOPIC     Status: Abnormal   Collection Time   03/10/12  4:00 PM      Component Value Range   Color, Urine YELLOW  YELLOW   APPearance CLEAR  CLEAR   Specific Gravity, Urine >1.030 (*) 1.005 - 1.030  pH 6.0  5.0 - 8.0   Glucose, UA NEGATIVE  NEGATIVE mg/dL   Hgb urine dipstick NEGATIVE  NEGATIVE   Bilirubin Urine SMALL (*) NEGATIVE   Ketones, ur 40 (*) NEGATIVE mg/dL   Protein, ur NEGATIVE  NEGATIVE mg/dL   Urobilinogen, UA 1.0  0.0 - 1.0 mg/dL   Nitrite NEGATIVE  NEGATIVE   Leukocytes, UA NEGATIVE  NEGATIVE    Assessment and Plan  Hyperemesis - No vomiting in MAU since arrival  Plan Get your prescription for phenergan suppositories filled and take if you are vomiting the Phenergan tablets. No smoking, no drugs, no alcohol.  Take a prenatal vitamin one by mouth every day.  Eat small frequent snacks to avoid nausea.  Begin prenatal care as soon as possible.  BURLESON,TERRI 03/10/2012, 4:39 PM

## 2012-03-11 NOTE — MAU Provider Note (Signed)
Chart reviewed and agree with management and plan.  

## 2012-03-14 ENCOUNTER — Other Ambulatory Visit: Payer: Self-pay | Admitting: Nurse Practitioner

## 2012-03-14 NOTE — MAU Provider Note (Signed)
Client called that she is unable to pick up Rx for Phenergan suppositories from the pharmacy - they did not receive the prescription.  Called in: Phenergan suppositories 25 mg on per rectum q 6 h PRN (#12) no refills to [facility] on [address].

## 2012-04-19 LAB — OB RESULTS CONSOLE ANTIBODY SCREEN: Antibody Screen: NEGATIVE

## 2012-04-19 LAB — OB RESULTS CONSOLE HEPATITIS B SURFACE ANTIGEN: Hepatitis B Surface Ag: NEGATIVE

## 2012-04-19 LAB — OB RESULTS CONSOLE RUBELLA ANTIBODY, IGM: Rubella: IMMUNE

## 2012-04-19 LAB — SICKLE CELL SCREEN: Sickle Cell Screen: NEGATIVE

## 2012-04-19 LAB — OB RESULTS CONSOLE HIV ANTIBODY (ROUTINE TESTING): HIV: NONREACTIVE

## 2012-04-19 LAB — OB RESULTS CONSOLE RPR: RPR: NONREACTIVE

## 2012-04-19 LAB — OB RESULTS CONSOLE PLATELET COUNT: Platelets: 335 10*3/uL

## 2012-06-30 NOTE — L&D Delivery Note (Signed)
Delivery Note At 12:56 PM a viable female was delivered via Vaginal, Spontaneous Delivery (Presentation: ; Occiput Anterior).  APGAR: 9, 9.   Placenta status: Intact, Spontaneous.  Cord: 3 vessels with the following complications: None.    Anesthesia: None  Episiotomy: None Lacerations: Bilateral labial Suture Repair: 3.0 vicryl rapide Est. Blood Loss (mL): 200 ml  Mom to postpartum.  Baby to nursery-stable.  JACKSON-MOORE,Kayin Kettering A 10/28/2012, 1:17 PM

## 2012-07-09 MED ORDER — RHO D IMMUNE GLOBULIN 1500 UNIT/2ML IJ SOLN: 300.0000 ug | Freq: Once | INTRAMUSCULAR | Status: AC

## 2012-08-03 ENCOUNTER — Inpatient Hospital Stay (HOSPITAL_COMMUNITY)
Admission: AD | Admit: 2012-08-03 | Discharge: 2012-08-03 | Disposition: A | Discharge status: Home / Self Care | Payer: Medicaid Other | Source: Ambulatory Visit | Attending: Obstetrics | Admitting: Obstetrics

## 2012-08-03 DIAGNOSIS — Z298 Encounter for other specified prophylactic measures: Secondary | ICD-10-CM | POA: Insufficient documentation

## 2012-08-03 DIAGNOSIS — Z2989 Encounter for other specified prophylactic measures: Secondary | ICD-10-CM | POA: Insufficient documentation

## 2012-08-03 MED ORDER — RHO D IMMUNE GLOBULIN 1500 UNIT/2ML IJ SOLN
300.0000 ug | Freq: Once | INTRAMUSCULAR | Status: AC
  Administered 2012-08-03: 300 ug via INTRAMUSCULAR
  Filled 2012-08-03: qty 2

## 2012-08-03 NOTE — MAU Note (Signed)
Rhophylac info given and reviewed, lab in now drawing blood, denies pain or bleeding, here for rhophylac injection only. RN to call pt when rhophylac is ready, has to go pick up her son

## 2012-08-04 LAB — RH IG WORKUP (INCLUDES ABO/RH)
Fetal Screen: NEGATIVE
Unit division: 0

## 2012-08-05 LAB — OB RESULTS CONSOLE PLATELET COUNT: Platelets: 218 10*3/uL

## 2012-08-05 LAB — OB RESULTS CONSOLE HGB/HCT, BLOOD: HCT: 32 %

## 2012-08-27 ENCOUNTER — Encounter (HOSPITAL_COMMUNITY): Payer: Self-pay | Admitting: Family

## 2012-08-27 ENCOUNTER — Inpatient Hospital Stay (HOSPITAL_COMMUNITY)
Admission: AD | Admit: 2012-08-27 | Discharge: 2012-08-27 | Disposition: A | Discharge status: Home / Self Care | Payer: Medicaid Other | Source: Ambulatory Visit | Attending: Obstetrics & Gynecology | Admitting: Obstetrics & Gynecology

## 2012-08-27 DIAGNOSIS — O4700 False labor before 37 completed weeks of gestation, unspecified trimester: Secondary | ICD-10-CM

## 2012-08-27 DIAGNOSIS — N949 Unspecified condition associated with female genital organs and menstrual cycle: Secondary | ICD-10-CM | POA: Insufficient documentation

## 2012-08-27 DIAGNOSIS — R109 Unspecified abdominal pain: Secondary | ICD-10-CM | POA: Insufficient documentation

## 2012-08-27 DIAGNOSIS — O99891 Other specified diseases and conditions complicating pregnancy: Secondary | ICD-10-CM | POA: Insufficient documentation

## 2012-08-27 LAB — URINALYSIS, ROUTINE W REFLEX MICROSCOPIC
Hgb urine dipstick: NEGATIVE
Leukocytes, UA: NEGATIVE
Nitrite: NEGATIVE
Protein, ur: NEGATIVE mg/dL
Specific Gravity, Urine: 1.015 (ref 1.005–1.030)
Urobilinogen, UA: 0.2 mg/dL (ref 0.0–1.0)

## 2012-08-27 LAB — WET PREP, GENITAL
Trich, Wet Prep: NONE SEEN
Yeast Wet Prep HPF POC: NONE SEEN

## 2012-08-27 MED ORDER — ABDOMINAL BINDER/ELASTIC MED MISC: 1.0000 [IU] | Freq: Every day | Status: DC

## 2012-08-27 NOTE — MAU Note (Signed)
Patient presents to MAU with c/o lower abdominal and pelvic pressure since 1400 today, particularly when standing; denies LOF, vaginal bleeding.

## 2012-08-27 NOTE — MAU Provider Note (Signed)
History     CSN: [id number]  Arrival date and time: 08/27/12 6578   First Provider Initiated Contact with Patient 08/27/12 2027      Chief Complaint  Patient presents with  . Pelvic Pain   HPI [name] is a 27 y.o. G2P1001 at [redacted]w[redacted]d who presents to MAU today with complaint of lower abdominal pressure. The patient states that this started about 1400 today. It has been unchanged since then. She feels it mostly with standing and ambulation. She has some discomfort while laying down off and on, but it is not the same as with ambulation. The patient denies vaginal bleeding, abnormal discharge, LOF or contractions. She reports good fetal movement. The patient did call the office prior to coming to MAU. She was last seen in the office on 08/19/12 and has next appointment on 09/02/12.    OB History   Grav Para Term Preterm Abortions TAB SAB Ect Mult Living   2 1 1       1       Past Medical History  Diagnosis Date  . BV (bacterial vaginosis)   . Chlamydia   . PID (acute pelvic inflammatory disease)   . Ovarian cyst   . Yeast vaginitis   . PONV (postoperative nausea and vomiting)     nausea    Past Surgical History  Procedure Laterality Date  . Wisdom tooth extraction      Family History  Problem Relation Age of Onset  . Other Neg Hx     History  Substance Use Topics  . Smoking status: Former Smoker -- 1 years    Types: Cigarettes    Quit date: 03/02/2012  . Smokeless tobacco: Never Used  . Alcohol Use: Yes     Comment: past drink in July, not since.    Allergies:  Allergies  Allergen Reactions  . Penicillins     angiodema  . Shellfish Allergy Hives    Prescriptions prior to admission  Medication Sig Dispense Refill  . calcium carbonate (TUMS - DOSED IN MG ELEMENTAL CALCIUM) 500 MG chewable tablet Chew 1 tablet by mouth as needed for heartburn.      . Prenatal Vit-Fe Fumarate-FA (PRENATAL MULTIVITAMIN) TABS Take 1 tablet by mouth daily at 12 noon.         Review of Systems  Constitutional: Negative for fever, chills and malaise/fatigue.  Gastrointestinal: Positive for abdominal pain. Negative for nausea, vomiting, diarrhea and constipation.  Genitourinary: Negative for dysuria, urgency and frequency.   Physical Exam   Blood pressure 115/59, pulse 85, temperature 97.6 F (36.4 C), temperature source Oral, resp. rate 20, height 5\' 1"  (1.549 m), weight 167 lb 3.2 oz (75.841 kg), last menstrual period 01/01/2012, SpO2 100.00%.  Physical Exam  Constitutional: She is oriented to person, place, and time. She appears well-developed and well-nourished. No distress.  HENT:  Head: Normocephalic and atraumatic.  Cardiovascular: Normal rate, regular rhythm and normal heart sounds.   Respiratory: Effort normal and breath sounds normal. No respiratory distress.  GI: Soft. Bowel sounds are normal. She exhibits no distension and no mass. There is tenderness (mild diffuse tenderness to palpation of the mid-abdomen). There is no rebound and no guarding.  Neurological: She is alert and oriented to person, place, and time.  Skin: Skin is warm and dry.  Psychiatric: She has a normal mood and affect.  Cervix: long, thick, soft, closed  Fetal Monitoring: Baseline: 140 bpm, moderate variability, + acceleration, no decelerations Contractions: none  Results for orders placed during the hospital encounter of 08/27/12 (from the past 24 hour(s))  URINALYSIS, ROUTINE W REFLEX MICROSCOPIC     Status: None   Collection Time    08/27/12  8:00 PM      Result Value Range   Color, Urine YELLOW  YELLOW   APPearance CLEAR  CLEAR   Specific Gravity, Urine 1.015  1.005 - 1.030   pH 6.0  5.0 - 8.0   Glucose, UA NEGATIVE  NEGATIVE mg/dL   Hgb urine dipstick NEGATIVE  NEGATIVE   Bilirubin Urine NEGATIVE  NEGATIVE   Ketones, ur NEGATIVE  NEGATIVE mg/dL   Protein, ur NEGATIVE  NEGATIVE mg/dL   Urobilinogen, UA 0.2  0.0 - 1.0 mg/dL   Nitrite NEGATIVE  NEGATIVE    Leukocytes, UA NEGATIVE  NEGATIVE  WET PREP, GENITAL     Status: Abnormal   Collection Time    08/27/12  9:08 PM      Result Value Range   Yeast Wet Prep HPF POC NONE SEEN  NONE SEEN   Trich, Wet Prep NONE SEEN  NONE SEEN   Clue Cells Wet Prep HPF POC NONE SEEN  NONE SEEN   WBC, Wet Prep HPF POC FEW (*) NONE SEEN    MAU Course  Procedures None  MDM Wet prep - negative NST - reactive Cervix - closed Patient symptoms worse with standing/ambulation. 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 Will write out of work for tomorrow.   Assessment and Plan  A: Round ligament pain  P: Discharge home Rx for abdominal binder sent to patient's pharmacy Recommended tylenol for discomfort and warm baths to ease pain Note to be out of work tomorrow given Patient will follow-up as scheduled with Dr. Tamela Oddi this week Patient may return to MAU if her condition were to change or worsen  Freddi Starr, PA-C  08/27/2012, 8:27 PM

## 2012-08-27 NOTE — MAU Note (Signed)
Pelvic pressure since 1400. Some pelvic pain when walking. Denies leaking or bleeding .

## 2012-08-29 ENCOUNTER — Other Ambulatory Visit: Payer: Self-pay | Admitting: Advanced Practice Midwife

## 2012-08-29 MED ORDER — ABDOMINAL BINDER/ELASTIC MED MISC: 1.0000 [IU] | Freq: Every day | Status: DC

## 2012-09-05 ENCOUNTER — Encounter: Payer: Self-pay | Admitting: *Deleted

## 2012-09-16 ENCOUNTER — Encounter: Payer: Self-pay | Admitting: Obstetrics

## 2012-09-16 ENCOUNTER — Ambulatory Visit (INDEPENDENT_AMBULATORY_CARE_PROVIDER_SITE_OTHER): Payer: Medicaid Other | Admitting: Obstetrics

## 2012-09-16 VITALS — BP 110/62 | Temp 97.4°F | Wt 170.0 lb

## 2012-09-16 DIAGNOSIS — Z3480 Encounter for supervision of other normal pregnancy, unspecified trimester: Secondary | ICD-10-CM

## 2012-09-16 LAB — POCT URINALYSIS DIPSTICK
Bilirubin, UA: NEGATIVE
Blood, UA: NEGATIVE
Glucose, UA: NEGATIVE
Leukocytes, UA: NEGATIVE
Nitrite, UA: NEGATIVE
Urobilinogen, UA: NEGATIVE

## 2012-09-16 NOTE — Progress Notes (Signed)
No complaints. Doing well.

## 2012-09-16 NOTE — Progress Notes (Signed)
Pulse-90 Pt reports needs FMLA paperwork faxed.

## 2012-09-22 ENCOUNTER — Inpatient Hospital Stay (HOSPITAL_COMMUNITY)
Admission: AD | Admit: 2012-09-22 | Discharge: 2012-09-22 | Disposition: A | Discharge status: Home / Self Care | Payer: Medicaid Other | Source: Ambulatory Visit | Attending: Obstetrics & Gynecology | Admitting: Obstetrics & Gynecology

## 2012-09-22 ENCOUNTER — Encounter (HOSPITAL_COMMUNITY): Payer: Self-pay | Admitting: *Deleted

## 2012-09-22 DIAGNOSIS — R0602 Shortness of breath: Secondary | ICD-10-CM | POA: Insufficient documentation

## 2012-09-22 DIAGNOSIS — O99891 Other specified diseases and conditions complicating pregnancy: Secondary | ICD-10-CM | POA: Insufficient documentation

## 2012-09-22 MED ORDER — ALBUTEROL SULFATE HFA 108 (90 BASE) MCG/ACT IN AERS: 1.0000 | INHALATION_SPRAY | Freq: Four times a day (QID) | RESPIRATORY_TRACT | Status: DC | PRN

## 2012-09-22 NOTE — MAU Provider Note (Signed)
  History     CSN: [id number]  Arrival date and time: 09/22/12 1058   First Provider Initiated Contact with Patient 09/22/12 1149      Chief Complaint  Patient presents with  . Shortness of Breath   HPI Virginia Beltran 27 y.o. [redacted]w[redacted]d  Comes to MAU today with intermittent episodes of shortness of breath when walking or on exertion.  Hx of asthma.  Denies wheezing.  No URI.  Slept well last night with no awakening feeling short of breath.  OB History   Grav Para Term Preterm Abortions TAB SAB Ect Mult Living   2 1 1       1       Past Medical History  Diagnosis Date  . BV (bacterial vaginosis)   . Chlamydia   . PID (acute pelvic inflammatory disease)   . Ovarian cyst   . Yeast vaginitis   . PONV (postoperative nausea and vomiting)     nausea  . Asthma     Past Surgical History  Procedure Laterality Date  . Wisdom tooth extraction      Family History  Problem Relation Age of Onset  . Other Neg Hx   . Hyperlipidemia Other   . Hypertension Other     History  Substance Use Topics  . Smoking status: Former Smoker -- 1 years    Types: Cigars    Quit date: 03/02/2012  . Smokeless tobacco: Never Used  . Alcohol Use: No     Comment: past drink in July, not since.    Allergies:  Allergies  Allergen Reactions  . Penicillins     angiodema  . Shellfish Allergy Hives    Prescriptions prior to admission  Medication Sig Dispense Refill  . Prenatal Vit-Fe Fumarate-FA (PRENATAL MULTIVITAMIN) TABS Take 1 tablet by mouth daily at 12 noon.      . Elastic Bandages & Supports (ABDOMINAL BINDER/ELASTIC MED) MISC 1 Units by Does not apply route daily.  1 each  0  . Elastic Bandages & Supports (ABDOMINAL BINDER/ELASTIC MED) MISC 1 Units by Does not apply route daily.  1 each  0    ROS Physical Exam   Blood pressure 116/66, pulse 85, temperature 98.2 F (36.8 C), temperature source Oral, resp. rate 20, height 5' 1.5" (1.562 m), weight 173 lb (78.472 kg), last menstrual  period 01/01/2012, SpO2 100.00%.  Physical Exam  Nursing note and vitals reviewed. Constitutional: She is oriented to person, place, and time. She appears well-developed and well-nourished.  HENT:  Head: Normocephalic.  Eyes: EOM are normal.  Neck: Neck supple.  Cardiovascular: Normal rate.   Respiratory: Effort normal and breath sounds normal.  GI:  FHT baseline 140 and reactive on monitor strip.  Ocassional contraction.   Musculoskeletal: Normal range of motion.  Neurological: She is alert and oriented to person, place, and time.  Skin: Skin is warm and dry.  Psychiatric: She has a normal mood and affect.    MAU Course  Procedures  MDM 1150  Dr. Tamela Oddi notified of client's condition  Assessment and Plan  Normal exam [redacted] week gestation Shortness of breath with O2 sat 100%  Plan rx Albuterol inhaler as client has history of asthma If symptoms worsen, seek additional care, but currently client is stable.  BURLESON,TERRI 09/22/2012, 11:50 AM

## 2012-09-22 NOTE — MAU Note (Signed)
Patient states she has periods of feeling short of breath. States having some contractions, no bleeding or leaking. Reports good fetal movement.

## 2012-09-27 ENCOUNTER — Ambulatory Visit (INDEPENDENT_AMBULATORY_CARE_PROVIDER_SITE_OTHER): Payer: Medicaid Other | Admitting: Obstetrics & Gynecology

## 2012-09-27 ENCOUNTER — Encounter: Payer: Self-pay | Admitting: Obstetrics & Gynecology

## 2012-09-27 VITALS — BP 115/76 | Temp 98.0°F | Wt 170.4 lb

## 2012-09-27 DIAGNOSIS — J45909 Unspecified asthma, uncomplicated: Secondary | ICD-10-CM

## 2012-09-27 DIAGNOSIS — Z3483 Encounter for supervision of other normal pregnancy, third trimester: Secondary | ICD-10-CM

## 2012-09-27 DIAGNOSIS — Z3480 Encounter for supervision of other normal pregnancy, unspecified trimester: Secondary | ICD-10-CM

## 2012-09-27 LAB — POCT URINALYSIS DIPSTICK
Blood, UA: NEGATIVE
Glucose, UA: 100
Nitrite, UA: NEGATIVE
Spec Grav, UA: 1.015
Urobilinogen, UA: NEGATIVE

## 2012-09-27 NOTE — Patient Instructions (Signed)
Patient information: Group B streptococcus and pregnancy (Beyond the Basics)  Authors Karen M Puopolo, MD, PhD Carol J Baker, MD Section Editors Charles J Lockwood, MD Daniel J Sexton, MD Deputy Editor Vanessa A Barss, MD Disclosures  All topics are updated as new evidence becomes available and our peer review process is complete.  Literature review current through: Feb 2014.  This topic last updated: Dec 29, 2011.  INTRODUCTION - Group B streptococcus (GBS) is a bacterium that can cause serious infections in pregnant women and newborn babies. GBS is one of many types of streptococcal bacteria, sometimes called "strep." This article discusses GBS, its effect on pregnant women and infants, and ways to prevent complications of GBS. More detailed information about GBS is available by subscription. (See "Group B streptococcal infection in pregnant women".) WHAT IS GROUP B STREP INFECTION? - GBS is commonly found in the digestive system and the vagina. In healthy adults, GBS is not harmful and does not cause problems. But in pregnant women and newborn infants, being infected with GBS can cause serious illness. Approximately one in three to four pregnant women in the [place] carries GBS in their gastrointestinal system and/or in their vagina. Carrying GBS is not the same as being infected. Carriers are not sick and do not need treatment during pregnancy. There is no treatment that can stop you from carrying GBS.  Pregnant women who are carriers of GBS infrequently become infected with GBS. GBS can cause urinary tract infections, infection of the amniotic fluid (bag of water), and infection of the uterus after delivery. GBS infections during pregnancy may lead to preterm labor.  Pregnant women who carry GBS can pass on the bacteria to their newborns, and some of those babies become infected with GBS. Newborns who are infected with GBS can develop pneumonia (lung infection), septicemia (blood infection), or  meningitis (infection of the lining of the brain and spinal cord). These complications can be prevented by giving intravenous antibiotics during labor to any woman who is at risk of GBS infection. You are at risk of GBS infection if: You have a urine culture during your current pregnancy showing GBS  You have a vaginal and rectal culture during your current pregnancy showing GBS  You had an infant infected with GBS in the past GROUP B STREP PREVENTION - Most doctors and nurses recommend a urine culture early in your pregnancy to be sure that you do not have a bladder infection without symptoms. If you urine culture shows GBS or other bacteria, you may be treated with an antibiotic. If you have symptoms of urinary infection, such as pain with urination, any time during your pregnancy, a urine culture is done. If GBS grows from the urine culture, it should be treated with an antibiotic, and you should also receive intravenous antibiotics during labor. Expert groups recommend that all pregnant women have a GBS culture at 35 to 37 weeks of pregnancy. The culture is done by swabbing the vagina and rectum. If your GBS culture is positive, you will be given an intravenous antibiotic during labor. If you have preterm labor, the culture is done then and an intravenous antibiotic is given until the baby is born or the labor is stopped by your health care provider. If you have a positive GBS culture and you have an allergy to penicillin, be sure your doctor and nurse are aware of this allergy and tell them what happened with the allergy. If you had only a rash or itching, this   is not a serious allergy, and you can receive a common drug related to the penicillin. If you had a serious allergy (for example, trouble breathing, swelling of your face) you may need an additional test to determine which antibiotic should be used during labor. Being treated with an antibiotic during labor greatly reduces the chance that you or  your newborn will develop infections related to GBS. It is important to note that young infants up to age 27 months can also develop septicemia, meningitis and other serious infections from GBS. Being treated with an antibiotic during labor does not reduce the chance that your baby will develop this later type of infection. There is currently no known way of preventing this later-onset GBS disease. WHERE TO GET MORE INFORMATION - Your healthcare provider is the best source of information for questions and concerns related to your medical problem.  

## 2012-09-27 NOTE — Progress Notes (Signed)
Doing well 

## 2012-09-27 NOTE — Addendum Note (Signed)
Addended by: Glendell Docker on: 09/27/2012 12:00 PM   Modules accepted: Orders

## 2012-09-27 NOTE — Progress Notes (Signed)
Pulse - 84.  Couple braxton hicks a day - no pattern.

## 2012-09-27 NOTE — Progress Notes (Signed)
Peak flow performed- 1st attempt      245     2nd-attempt  400    3rd attempt  450

## 2012-10-04 ENCOUNTER — Ambulatory Visit (INDEPENDENT_AMBULATORY_CARE_PROVIDER_SITE_OTHER): Payer: Medicaid Other | Admitting: Obstetrics & Gynecology

## 2012-10-04 VITALS — BP 111/72 | Temp 98.6°F | Wt 174.0 lb

## 2012-10-04 DIAGNOSIS — Z3483 Encounter for supervision of other normal pregnancy, third trimester: Secondary | ICD-10-CM

## 2012-10-04 DIAGNOSIS — Z3480 Encounter for supervision of other normal pregnancy, unspecified trimester: Secondary | ICD-10-CM

## 2012-10-04 LAB — POCT URINALYSIS DIPSTICK
Blood, UA: NEGATIVE
Ketones, UA: NEGATIVE
Protein, UA: NEGATIVE
Spec Grav, UA: <=1.005
Urobilinogen, UA: NEGATIVE
pH, UA: 7

## 2012-10-04 NOTE — Progress Notes (Signed)
P 81 Pt reports some pressure- hip area

## 2012-10-05 ENCOUNTER — Encounter: Payer: Self-pay | Admitting: Obstetrics & Gynecology

## 2012-10-05 NOTE — Progress Notes (Signed)
Doing well 

## 2012-10-12 ENCOUNTER — Ambulatory Visit (INDEPENDENT_AMBULATORY_CARE_PROVIDER_SITE_OTHER): Payer: Medicaid Other | Admitting: Obstetrics

## 2012-10-12 ENCOUNTER — Encounter: Payer: Self-pay | Admitting: Obstetrics

## 2012-10-12 VITALS — BP 117/73 | Temp 97.4°F | Wt 176.6 lb

## 2012-10-12 DIAGNOSIS — Z3483 Encounter for supervision of other normal pregnancy, third trimester: Secondary | ICD-10-CM

## 2012-10-12 DIAGNOSIS — Z3480 Encounter for supervision of other normal pregnancy, unspecified trimester: Secondary | ICD-10-CM

## 2012-10-12 NOTE — Progress Notes (Signed)
SG 1010,7

## 2012-10-12 NOTE — Progress Notes (Signed)
Pulse- 80 

## 2012-10-16 ENCOUNTER — Inpatient Hospital Stay (HOSPITAL_COMMUNITY): Payer: Medicaid Other

## 2012-10-16 ENCOUNTER — Inpatient Hospital Stay (HOSPITAL_COMMUNITY)
Admission: AD | Admit: 2012-10-16 | Discharge: 2012-10-17 | Disposition: A | Discharge status: Home / Self Care | Payer: Medicaid Other | Source: Ambulatory Visit | Attending: Obstetrics & Gynecology | Admitting: Obstetrics & Gynecology

## 2012-10-16 ENCOUNTER — Encounter (HOSPITAL_COMMUNITY): Payer: Self-pay

## 2012-10-16 DIAGNOSIS — R296 Repeated falls: Secondary | ICD-10-CM | POA: Insufficient documentation

## 2012-10-16 DIAGNOSIS — O479 False labor, unspecified: Secondary | ICD-10-CM

## 2012-10-16 DIAGNOSIS — N949 Unspecified condition associated with female genital organs and menstrual cycle: Secondary | ICD-10-CM | POA: Insufficient documentation

## 2012-10-16 DIAGNOSIS — R109 Unspecified abdominal pain: Secondary | ICD-10-CM | POA: Insufficient documentation

## 2012-10-16 DIAGNOSIS — Y93E9 Activity, other interior property and clothing maintenance: Secondary | ICD-10-CM | POA: Insufficient documentation

## 2012-10-16 DIAGNOSIS — W19XXXA Unspecified fall, initial encounter: Secondary | ICD-10-CM

## 2012-10-16 DIAGNOSIS — Y92009 Unspecified place in unspecified non-institutional (private) residence as the place of occurrence of the external cause: Secondary | ICD-10-CM | POA: Insufficient documentation

## 2012-10-16 NOTE — MAU Note (Signed)
Pt states she was cleaning and tripped over son's scooter. States she caught herself with her hands and slid on her stomach. Has some pelvic pain but denies vaginal bleeding or leaking fluid.

## 2012-10-16 NOTE — MAU Provider Note (Signed)
History     CSN: [id number]  Arrival date and time: 10/16/12 2028   First Provider Initiated Contact with Patient 10/16/12 2103      Chief Complaint  Patient presents with  . Fall  . Pelvic Pain   HPI  Pt is a G2P1001 at 38.4 wks IUP here with report of fall at 1730 today.  Pt reports was cleaning when she fell.  Pt braced her fall with outstretched arms,  Abdomen did not have direct trauma.  No report of vaginal bleeding or leaking of fluid.  +fetal movement.  Pain in groin and lower pelvis since the fall.  Past Medical History  Diagnosis Date  . BV (bacterial vaginosis)   . Chlamydia   . PID (acute pelvic inflammatory disease)   . Ovarian cyst   . Yeast vaginitis   . PONV (postoperative nausea and vomiting)     nausea  . Asthma     Past Surgical History  Procedure Laterality Date  . Wisdom tooth extraction      Family History  Problem Relation Age of Onset  . Other Neg Hx   . Hyperlipidemia Other   . Hypertension Other     History  Substance Use Topics  . Smoking status: Former Smoker -- 1 years    Types: Cigars    Quit date: 03/02/2012  . Smokeless tobacco: Never Used  . Alcohol Use: No     Comment: past drink in July, not since.    Allergies:  Allergies  Allergen Reactions  . Penicillins     angiodema  . Shellfish Allergy Hives    Prescriptions prior to admission  Medication Sig Dispense Refill  . albuterol (PROVENTIL HFA;VENTOLIN HFA) 108 (90 BASE) MCG/ACT inhaler Inhale 1-2 puffs into the lungs every 6 (six) hours as needed for wheezing.  1 Inhaler  0  . Prenatal Vit-Fe Fumarate-FA (PRENATAL MULTIVITAMIN) TABS Take 1 tablet by mouth daily at 12 noon.        Review of Systems  Gastrointestinal: Positive for abdominal pain (cramping).  Musculoskeletal: Positive for joint pain (groin).  All other systems reviewed and are negative.   Physical Exam   Blood pressure 109/59, pulse 90, temperature 98 F (36.7 C), temperature source Oral, resp.  rate 18, height 5\' 1"  (1.549 m), weight 80.105 kg (176 lb 9.6 oz), last menstrual period 01/01/2012, SpO2 98.00%.  Physical Exam  Constitutional: She is oriented to person, place, and time. She appears well-developed and well-nourished. No distress.  HENT:  Head: Normocephalic.  Neck: Normal range of motion. Neck supple.  Cardiovascular: Normal rate, regular rhythm and normal heart sounds.   Respiratory: Effort normal and breath sounds normal.  GI: Soft. There is no tenderness.  Genitourinary: No bleeding around the vagina. Vaginal discharge (mucusy) found.  Neurological: She is alert and oriented to person, place, and time.  Skin: Skin is warm and dry.   Dilation: 1 Effacement (%): 10 Cervical Position: Posterior Station: -3 Presentation: Vertex Exam by:: W. Jerolyn Center, CNM  FHR 120's, +accels, no decels UCs 3-5  MAU Course  Procedures  Ultrasound: IMPRESSION:  Single live intrauterine gestation.  Unremarkable placenta.  Normal amniotic fluid volume.  No acute abnormalities identified on limited assessment.  This exam is performed on an emergent basis and does not  comprehensively evaluate fetal size, dating, or anatomy, and a  follow-up complete OB US should be considered if further fetal  assessment is warranted.   Consulted with Dr. Trudie Buckler > reviewed HPI/exam/fetal tracing/cervical dilation/ultrasound >  DC home with labor and bleeding precautions.   Assessment and Plan  Deberah Pelton Fall in Pregnancy - Normal Exam  Plan: DC to home Explained early labor and signs of active labor Bleeding precautions  [facility] 10/16/2012, 9:21 PM

## 2012-10-17 DIAGNOSIS — R109 Unspecified abdominal pain: Secondary | ICD-10-CM

## 2012-10-17 DIAGNOSIS — O479 False labor, unspecified: Secondary | ICD-10-CM

## 2012-10-17 DIAGNOSIS — N949 Unspecified condition associated with female genital organs and menstrual cycle: Secondary | ICD-10-CM

## 2012-10-21 ENCOUNTER — Ambulatory Visit (INDEPENDENT_AMBULATORY_CARE_PROVIDER_SITE_OTHER): Payer: Medicaid Other | Admitting: Obstetrics & Gynecology

## 2012-10-21 VITALS — BP 117/75 | Temp 97.9°F | Wt 180.6 lb

## 2012-10-21 DIAGNOSIS — Z3483 Encounter for supervision of other normal pregnancy, third trimester: Secondary | ICD-10-CM

## 2012-10-21 DIAGNOSIS — Z3480 Encounter for supervision of other normal pregnancy, unspecified trimester: Secondary | ICD-10-CM

## 2012-10-21 LAB — POCT URINALYSIS DIPSTICK
Bilirubin, UA: NEGATIVE
Blood, UA: NEGATIVE
Ketones, UA: NEGATIVE
Leukocytes, UA: NEGATIVE
Protein, UA: NEGATIVE
Spec Grav, UA: <=1.005
pH, UA: 7

## 2012-10-21 NOTE — Progress Notes (Signed)
Pulse- 87 

## 2012-10-21 NOTE — Patient Instructions (Addendum)
Patient information: How to tell when labor starts (The Basics)View in SpanishWritten by the doctors and editors at UpToDate  What is labor? - Labor is the way a woman's body prepares to give birth. Labor usually starts on its own between 37 and 42 weeks of pregnancy. A woman's "due date" is at 40 weeks.  A pregnancy that lasts 37 to 42 weeks is called a "term" pregnancy. When labor starts before 37 weeks, doctors call it "preterm" labor. What are the signs that labor is starting? - The different signs that labor is starting can include the following: ?The baby moves lower (or "drops") in your belly.  ?You have increased vaginal discharge that is thick, mucus-like, or slightly bloody. (Vaginal discharge is the term doctors use to describe the fluid that comes out of the vagina.) The increased vaginal discharge is sometimes called a "mucus plug" or a "bloody show."  ?Your water breaks. During pregnancy, your baby is in a sac in your uterus and surrounded by a fluid called "amniotic fluid." This sac will break open sometime before your baby is born. When it breaks open, the fluid inside comes out of your vagina. This can feel like a gush or trickle of fluid. ?You have low back pain or belly cramps. ?You start having contractions. During a contraction, the uterus tightens. This can be painful and make your belly feel hard. After a contraction, the uterus relaxes and the pain goes away. Some women have "Braxton Hicks contractions" or "false labor contractions." These feel like contractions, but they are not true contractions. They do not mean that you are in labor.  How can I tell if I'm having true contractions? - It can be hard to tell if you are having true contractions or Braxton Hicks contractions. But here are some ways to help tell the difference.  ?True contractions come every few minutes and get more frequent over time. Braxton Hicks contractions can come every few minutes, but they don't get more  frequent over time. ?True contractions don't go away, even when you rest. Braxton Hicks contractions usually go away when you rest. ?True contractions will get stronger and more painful over time. Braxton Hicks contractions usually don't get stronger or more painful over time. If you are still not sure whether you are having true contractions, call your doctor or midwife. What should I do if I start having contractions? - If you start having contractions, you should time them to see how far apart they are. That way, you can tell if they get more frequent.  You can time your contractions by writing down the time when each contraction starts. If you have a clock with a second hand, you can also time how long each contraction lasts. Your doctor or midwife will want to know how far apart your contractions are and how long they last. When should I call my doctor or midwife? - Call your doctor or midwife if you think you are in labor. You should also call if ANY of the following things happen: ?You have blood, mucus, or fluid leaking from your vagina. ?You have 6 or more contractions in 1 hour. (That means your contractions are 10 minutes apart or less.) ?Your contractions are getting stronger and are painful. Your doctor or midwife will probably want to see you to do an exam. To tell if you are in labor, he or she will check your cervix to see if it is opening ("dilated") and thinning out. He or she   will see how frequent your contractions are. He or she might also do other tests. What if my labor starts too soon? - If you start having any symptoms of labor before 37 weeks, call your doctor right away. He or she might want to give you medicine to try to stop your labor.  What if my labor doesn't start on its own? - If your labor doesn't start on its own, your doctor will talk to you about your options. He or she might try to start your labor with medicines. This is called "inducing labor." How long will my  labor last? - If it's your first baby, your labor will probably last for many hours. If it's not your first baby, your labor will probably be shorter.  

## 2012-10-22 ENCOUNTER — Encounter: Payer: Self-pay | Admitting: Obstetrics & Gynecology

## 2012-10-22 NOTE — Progress Notes (Signed)
Doing well 

## 2012-10-28 ENCOUNTER — Encounter: Payer: Medicaid Other | Admitting: Obstetrics & Gynecology

## 2012-10-28 ENCOUNTER — Inpatient Hospital Stay (HOSPITAL_COMMUNITY)
Admission: AD | Admit: 2012-10-28 | Discharge: 2012-10-30 | DRG: 775 | Disposition: A | Discharge status: Home / Self Care | Payer: Medicaid Other | Source: Ambulatory Visit | Attending: Obstetrics & Gynecology | Admitting: Obstetrics & Gynecology

## 2012-10-28 ENCOUNTER — Encounter (HOSPITAL_COMMUNITY): Payer: Self-pay | Admitting: *Deleted

## 2012-10-28 DIAGNOSIS — IMO0001 Reserved for inherently not codable concepts without codable children: Secondary | ICD-10-CM

## 2012-10-28 LAB — CBC
MCH: 21.9 pg — ABNORMAL LOW (ref 26.0–34.0)
MCHC: 32.2 g/dL (ref 30.0–36.0)
MCV: 68 fL — ABNORMAL LOW (ref 78.0–100.0)
Platelets: 150 10*3/uL (ref 150–400)
RDW: 16.9 % — ABNORMAL HIGH (ref 11.5–15.5)
WBC: 10.4 10*3/uL (ref 4.0–10.5)

## 2012-10-28 MED ORDER — ALBUTEROL SULFATE HFA 108 (90 BASE) MCG/ACT IN AERS
1.0000 | INHALATION_SPRAY | Freq: Four times a day (QID) | RESPIRATORY_TRACT | Status: DC | PRN
  Filled 2012-10-28: qty 6.7

## 2012-10-28 MED ORDER — DIBUCAINE 1 % RE OINT: 1.0000 "application " | TOPICAL_OINTMENT | RECTAL | Status: DC | PRN

## 2012-10-28 MED ORDER — OXYTOCIN 40 UNITS IN LACTATED RINGERS INFUSION - SIMPLE MED: 62.5000 mL/h | INTRAVENOUS | Status: DC

## 2012-10-28 MED ORDER — LANOLIN HYDROUS EX OINT: TOPICAL_OINTMENT | CUTANEOUS | Status: DC | PRN

## 2012-10-28 MED ORDER — MEDROXYPROGESTERONE ACETATE 150 MG/ML IM SUSP: 150.0000 mg | INTRAMUSCULAR | Status: DC | PRN

## 2012-10-28 MED ORDER — OXYCODONE-ACETAMINOPHEN 5-325 MG PO TABS: 1.0000 | ORAL_TABLET | ORAL | Status: DC | PRN

## 2012-10-28 MED ORDER — ACETAMINOPHEN 325 MG PO TABS: 650.0000 mg | ORAL_TABLET | ORAL | Status: DC | PRN

## 2012-10-28 MED ORDER — FLEET ENEMA 7-19 GM/118ML RE ENEM: 1.0000 | ENEMA | Freq: Once | RECTAL | Status: DC

## 2012-10-28 MED ORDER — MEASLES, MUMPS & RUBELLA VAC ~~LOC~~ INJ
0.5000 mL | INJECTION | Freq: Once | SUBCUTANEOUS | Status: DC
  Filled 2012-10-28: qty 0.5

## 2012-10-28 MED ORDER — ZOLPIDEM TARTRATE 5 MG PO TABS: 5.0000 mg | ORAL_TABLET | Freq: Every evening | ORAL | Status: DC | PRN

## 2012-10-28 MED ORDER — ONDANSETRON HCL 4 MG/2ML IJ SOLN: 4.0000 mg | INTRAMUSCULAR | Status: DC | PRN

## 2012-10-28 MED ORDER — IBUPROFEN 600 MG PO TABS
600.0000 mg | ORAL_TABLET | Freq: Four times a day (QID) | ORAL | Status: DC | PRN
  Administered 2012-10-28: 600 mg via ORAL
  Filled 2012-10-28: qty 1

## 2012-10-28 MED ORDER — IBUPROFEN 600 MG PO TABS
600.0000 mg | ORAL_TABLET | Freq: Four times a day (QID) | ORAL | Status: DC
  Administered 2012-10-28 – 2012-10-30 (×7): 600 mg via ORAL
  Filled 2012-10-28 (×7): qty 1

## 2012-10-28 MED ORDER — MAGNESIUM HYDROXIDE 400 MG/5ML PO SUSP: 30.0000 mL | ORAL | Status: DC | PRN

## 2012-10-28 MED ORDER — DIPHENHYDRAMINE HCL 25 MG PO CAPS: 25.0000 mg | ORAL_CAPSULE | Freq: Four times a day (QID) | ORAL | Status: DC | PRN

## 2012-10-28 MED ORDER — CITRIC ACID-SODIUM CITRATE 334-500 MG/5ML PO SOLN: 30.0000 mL | ORAL | Status: DC | PRN

## 2012-10-28 MED ORDER — LACTATED RINGERS IV SOLN: 500.0000 mL | INTRAVENOUS | Status: DC | PRN

## 2012-10-28 MED ORDER — OXYTOCIN BOLUS FROM INFUSION: 500.0000 mL | INTRAVENOUS | Status: DC

## 2012-10-28 MED ORDER — WITCH HAZEL-GLYCERIN EX PADS: 1.0000 "application " | MEDICATED_PAD | CUTANEOUS | Status: DC | PRN

## 2012-10-28 MED ORDER — FERROUS SULFATE 325 (65 FE) MG PO TABS
325.0000 mg | ORAL_TABLET | Freq: Two times a day (BID) | ORAL | Status: DC
  Administered 2012-10-28 – 2012-10-30 (×4): 325 mg via ORAL
  Filled 2012-10-28 (×4): qty 1

## 2012-10-28 MED ORDER — LACTATED RINGERS IV SOLN
INTRAVENOUS | Status: DC
  Administered 2012-10-28: 05:00:00 via INTRAVENOUS

## 2012-10-28 MED ORDER — TERBUTALINE SULFATE 1 MG/ML IJ SOLN: 0.2500 mg | Freq: Once | INTRAMUSCULAR | Status: DC | PRN

## 2012-10-28 MED ORDER — TETANUS-DIPHTH-ACELL PERTUSSIS 5-2.5-18.5 LF-MCG/0.5 IM SUSP
0.5000 mL | Freq: Once | INTRAMUSCULAR | Status: AC
  Administered 2012-10-29: 0.5 mL via INTRAMUSCULAR
  Filled 2012-10-28: qty 0.5

## 2012-10-28 MED ORDER — LIDOCAINE HCL (PF) 1 % IJ SOLN
30.0000 mL | INTRAMUSCULAR | Status: AC | PRN
  Administered 2012-10-28: 30 mL via SUBCUTANEOUS
  Filled 2012-10-28 (×2): qty 30

## 2012-10-28 MED ORDER — BENZOCAINE-MENTHOL 20-0.5 % EX AERO
1.0000 "application " | INHALATION_SPRAY | CUTANEOUS | Status: DC | PRN
  Filled 2012-10-28: qty 56

## 2012-10-28 MED ORDER — BUTORPHANOL TARTRATE 1 MG/ML IJ SOLN
1.0000 mg | INTRAMUSCULAR | Status: DC | PRN
  Administered 2012-10-28 (×3): 1 mg via INTRAVENOUS
  Filled 2012-10-28 (×3): qty 1

## 2012-10-28 MED ORDER — PRENATAL MULTIVITAMIN CH
1.0000 | ORAL_TABLET | Freq: Every day | ORAL | Status: DC
  Administered 2012-10-28 – 2012-10-30 (×3): 1 via ORAL
  Filled 2012-10-28 (×2): qty 1

## 2012-10-28 MED ORDER — ONDANSETRON HCL 4 MG PO TABS: 4.0000 mg | ORAL_TABLET | ORAL | Status: DC | PRN

## 2012-10-28 MED ORDER — ONDANSETRON HCL 4 MG/2ML IJ SOLN: 4.0000 mg | Freq: Four times a day (QID) | INTRAMUSCULAR | Status: DC | PRN

## 2012-10-28 MED ORDER — SENNOSIDES-DOCUSATE SODIUM 8.6-50 MG PO TABS
2.0000 | ORAL_TABLET | Freq: Every day | ORAL | Status: DC
  Administered 2012-10-28: 2 via ORAL

## 2012-10-28 MED ORDER — OXYTOCIN 40 UNITS IN LACTATED RINGERS INFUSION - SIMPLE MED
1.0000 m[IU]/min | INTRAVENOUS | Status: DC
  Administered 2012-10-28: 2 m[IU]/min via INTRAVENOUS
  Administered 2012-10-28: 666 m[IU]/min via INTRAVENOUS
  Filled 2012-10-28: qty 1000

## 2012-10-28 NOTE — MAU Note (Signed)
Pt states has been contracting since 0200

## 2012-10-28 NOTE — H&P (Signed)
CAMARI QUINTANILLA is a 27 y.o. female presenting for contractions. Maternal Medical History:  Reason for admission: Contractions.   Contractions: Frequency: regular.   Perceived severity is strong.    Fetal activity: Perceived fetal activity is normal.    Prenatal complications: no prenatal complications Prenatal Complications - Diabetes: none.    OB History   Grav Para Term Preterm Abortions TAB SAB Ect Mult Living   2 1 1       1      Past Medical History  Diagnosis Date  . BV (bacterial vaginosis)   . Chlamydia   . PID (acute pelvic inflammatory disease)   . Ovarian cyst   . Yeast vaginitis   . PONV (postoperative nausea and vomiting)     nausea  . Asthma    Past Surgical History  Procedure Laterality Date  . Wisdom tooth extraction     Family History: family history includes Hyperlipidemia in her other and Hypertension in her other.  There is no history of Other. Social History:  reports that she quit smoking about 7 months ago. Her smoking use included Cigars. She has never used smokeless tobacco. She reports that she does not drink alcohol or use illicit drugs.     Review of Systems  Constitutional: Negative for fever.  Eyes: Negative for blurred vision.  Respiratory: Negative for shortness of breath.   Gastrointestinal: Negative for vomiting.  Skin: Negative for rash.  Neurological: Negative for headaches.      Blood pressure 104/65, pulse 73, temperature 98.3 F (36.8 C), temperature source Oral, resp. rate 20, height 5\' 1"  (1.549 m), weight 178 lb (80.74 kg), last menstrual period 01/01/2012, SpO2 100.00%. Maternal Exam:  Uterine Assessment: Contraction frequency is regular.   Abdomen: Fetal presentation: vertex  Introitus: not evaluated.   Cervix: Cervix evaluated by digital exam.     Physical Exam  Constitutional: She appears well-developed.  HENT:  Head: Normocephalic.  Neck: Neck supple. No thyromegaly present.  Cardiovascular: Normal  rate and regular rhythm.   Respiratory: Breath sounds normal.  GI: Soft. Bowel sounds are normal.  Skin: No rash noted.    Prenatal labs: ABO, Rh: --/--/A NEG (02/04 1305) Antibody: Negative (10/21 0000) Rubella: Immune (10/21 0000) RPR: Nonreactive (02/06 0000)  HBsAg: Negative (10/21 0000)  HIV: Non-reactive (02/06 0000)  GBS: Negative (03/31 0000)   Assessment/Plan: Primipara at term, active labor, Category 1 FHT Admit, anticipate an NSVD   JACKSON-MOORE,Aryona Sill A 10/28/2012, 8:12 AM

## 2012-10-29 MED ORDER — RHO D IMMUNE GLOBULIN 1500 UNIT/2ML IJ SOLN
300.0000 ug | Freq: Once | INTRAMUSCULAR | Status: AC
  Administered 2012-10-29: 300 ug via INTRAMUSCULAR
  Filled 2012-10-29: qty 2

## 2012-10-29 NOTE — Progress Notes (Signed)
UR chart review completed.  

## 2012-10-29 NOTE — Progress Notes (Signed)
Post Partum Day 1 Subjective: no complaints  Objective: Blood pressure 105/69, pulse 79, temperature 98 F (36.7 C), temperature source Oral, resp. rate 19, height 5\' 1"  (1.549 m), weight 178 lb (80.74 kg), last menstrual period 01/01/2012, SpO2 99.00%, unknown if currently breastfeeding.  Physical Exam:  General: alert and no distress Lochia: appropriate Uterine Fundus: firm Incision: healing well DVT Evaluation: No evidence of DVT seen on physical exam.   Recent Labs  10/28/12 0450  HGB 11.8*  HCT 36.7    Assessment/Plan: Plan for discharge tomorrow   LOS: 1 day   HARPER,CHARLES A 10/29/2012, 10:44 AM

## 2012-10-30 LAB — RH IG WORKUP (INCLUDES ABO/RH)
ABO/RH(D): A NEG
Unit division: 0

## 2012-10-30 NOTE — Discharge Summary (Signed)
  Obstetric Discharge Summary Reason for Admission: onset of labor Prenatal Procedures: none Intrapartum Procedures: spontaneous vaginal delivery Postpartum Procedures: none Complications-Operative and Postpartum: none  Hemoglobin  Date Value Range Status  10/28/2012 11.8* 12.0 - 15.0 g/dL Final  4/0/9811 91.4   Final     HCT  Date Value Range Status  10/28/2012 36.7  36.0 - 46.0 % Final  08/05/2012 32   Final    Physical Exam:  General: alert Lochia: appropriate Uterine: firm Incision: n/Beltran DVT Evaluation: No evidence of DVT seen on physical exam.  Discharge Diagnoses: Active Problems:   Active labor   Normal delivery   Discharge Information: Date: 10/30/2012 Activity: pelvic rest Diet: routine Medications:  Prior to Admission medications   Medication Sig Start Date End Date Taking? Authorizing Provider  Prenatal Vit-Fe Fumarate-FA (PRENATAL MULTIVITAMIN) TABS Take 1 tablet by mouth daily at 12 noon.   Yes Historical Provider, MD  albuterol (PROVENTIL HFA;VENTOLIN HFA) 108 (90 BASE) MCG/ACT inhaler Inhale 1-2 puffs into the lungs every 6 (six) hours as needed for wheezing. 09/22/12   Virginia Paris, NP    Condition: stable Instructions: refer to routine discharge instructions Discharge to: home   Newborn Data: Live born  Information for the patient's newborn:  Virginia, Beltran [[id number]]  female ; APGAR (1 MIN): 9   APGAR (5 MINS): 9   Home with mother.  Virginia,Aryianna Earwood Beltran 10/30/2012, 10:24 AM

## 2012-11-11 ENCOUNTER — Ambulatory Visit (INDEPENDENT_AMBULATORY_CARE_PROVIDER_SITE_OTHER): Payer: Medicaid Other | Admitting: Obstetrics & Gynecology

## 2012-11-11 ENCOUNTER — Encounter: Payer: Self-pay | Admitting: Obstetrics & Gynecology

## 2012-11-11 NOTE — Progress Notes (Signed)
Subjective:     Virginia Beltran is a 27 y.o. female who presents for a postpartum visit. She is 2 weeks postpartum following a spontaneous vaginal delivery. I have fully reviewed the prenatal and intrapartum course. The delivery was at 40.2 gestational weeks. Outcome: spontaneous vaginal delivery. Anesthesia: IV sedation. Postpartum course has been normal. Baby's course has been normal. Baby is feeding by bottle Loyal Buba. Bleeding no bleeding. Bowel function is normal. Bladder function is normal. Patient is not sexually active. Contraception method is abstinence. Postpartum depression screening: negative.  The following portions of the patient's history were reviewed and updated as appropriate: allergies, current medications, past family history, past medical history, past social history, past surgical history and problem list.  Review of Systems Pertinent items are noted in HPI.   Objective:    BP 112/69  Pulse 86  Temp(Src) 98.6 F (37 C)  Wt 159 lb (72.122 kg)  BMI 30.06 kg/m2  LMP 01/01/2012  Breastfeeding? No       No exam today Assessment:    Doing well postpartum.   Plan:    Contraception: plans Nexplanon  Follow up in: 1 month or as needed.

## 2012-11-11 NOTE — Patient Instructions (Addendum)

## 2012-12-15 ENCOUNTER — Encounter: Payer: Self-pay | Admitting: Obstetrics & Gynecology

## 2012-12-15 ENCOUNTER — Ambulatory Visit (INDEPENDENT_AMBULATORY_CARE_PROVIDER_SITE_OTHER): Payer: Medicaid Other | Admitting: Obstetrics & Gynecology

## 2012-12-15 VITALS — BP 120/78 | HR 58 | Temp 98.1°F | Ht 61.5 in | Wt 160.0 lb

## 2012-12-15 DIAGNOSIS — Z3202 Encounter for pregnancy test, result negative: Secondary | ICD-10-CM

## 2012-12-15 DIAGNOSIS — IMO0001 Reserved for inherently not codable concepts without codable children: Secondary | ICD-10-CM

## 2012-12-15 DIAGNOSIS — Z30017 Encounter for initial prescription of implantable subdermal contraceptive: Secondary | ICD-10-CM

## 2012-12-15 LAB — POCT URINE PREGNANCY: Preg Test, Ur: NEGATIVE

## 2012-12-15 NOTE — Progress Notes (Signed)
.   Subjective:     Virginia Beltran is a 27 y.o. female who presents for a postpartum visit. She is 6 weeks postpartum following a spontaneous vaginal delivery. I have fully reviewed the prenatal and intrapartum course. The delivery was at 40 gestational weeks. Outcome: spontaneous vaginal delivery. Anesthesia: none. Postpartum course has been normal. Baby's course has been normal. Baby is feeding by bottle - Enfamil - sensitive. Bleeding no bleeding. Bowel function is normal. Bladder function is normal. Patient is sexually active. Contraception method is none. Postpartum depression screening: negative.  The following portions of the patient's history were reviewed and updated as appropriate: allergies, current medications, past family history, past medical history, past social history, past surgical history and problem list.  Review of Systems Pertinent items are noted in HPI.   Objective:    BP 120/78  Pulse 58  Temp(Src) 98.1 F (36.7 C) (Oral)  Ht 5' 1.5" (1.562 m)  Wt 160 lb (72.576 kg)  BMI 29.75 kg/m2  LMP 12/10/2012  Breastfeeding? No        General:  alert     Abdomen: soft, non-tender; bowel sounds normal; no masses,  no organomegaly   Vulva:  normal  Vagina: normal vagina  Cervix:  no lesions  Corpus: normal size, contour, position, consistency, mobility, non-tender  Adnexa:  normal adnexa   Assessment:     Normal postpartum exam.   Plan:   F/U prn

## 2012-12-19 ENCOUNTER — Encounter: Payer: Self-pay | Admitting: Obstetrics & Gynecology

## 2012-12-19 NOTE — Progress Notes (Signed)
Patient ID: Virginia Beltran, female   DOB: April 23, 1986, 27 y.o.   MRN: [id number] NEXPLANON INSERTION NOTE      Pregnancy test result:  Lab Results  Component Value Date   PREGTESTUR Negative 12/15/2012    Indications:  The patient desires contraception.  She understands risks, benefits, and alternatives to Implanon and would like to proceed.  Anesthesia:   Lidocaine 1% plain.  Procedure:  A time-out was performed confirming the procedure and the patient's allergy status.  The patient's non-dominant was identified as the left arm.  The protection cap was removed. While placing countertraction on the skin, the needle was inserted at a 30 degree angle.  The applicator was held horizontal to the skin; the skin was tented upward as the needle was introduced into the subdermal space.  While holding the applicator in place, the slider was unlocked. The Nexplanon was removed from the field.  The Nexplanon was palpated to ensure proper placement.  Complications: None  Instructions:  The patient was instructed to remove the dressing in 24 hours and that some bruising is to be expected.  She was advised to use over the counter analgesics as needed for any pain at the site.  She is to keep the area dry for 24 hours and to call if her hand or arm becomes cold, numb, or blue.  Return visit:  Return in 6+ weeks

## 2012-12-19 NOTE — Patient Instructions (Addendum)

## 2012-12-21 MED ORDER — ETONOGESTREL 68 MG ~~LOC~~ IMPL: 68.0000 mg | DRUG_IMPLANT | Freq: Once | SUBCUTANEOUS | Status: AC

## 2012-12-21 NOTE — Addendum Note (Signed)
Addended by: Glendell Docker on: 12/21/2012 08:59 AM   Modules accepted: Orders

## 2013-01-20 ENCOUNTER — Ambulatory Visit: Payer: Medicaid Other | Admitting: Obstetrics & Gynecology

## 2013-08-15 ENCOUNTER — Emergency Department (INDEPENDENT_AMBULATORY_CARE_PROVIDER_SITE_OTHER)
Admission: EM | Admit: 2013-08-15 | Discharge: 2013-08-15 | Disposition: A | Discharge status: Home / Self Care | Payer: Self-pay | Source: Home / Self Care | Attending: Emergency Medicine | Admitting: Emergency Medicine

## 2013-08-15 ENCOUNTER — Encounter (HOSPITAL_COMMUNITY): Payer: Self-pay | Admitting: Emergency Medicine

## 2013-08-15 DIAGNOSIS — A084 Viral intestinal infection, unspecified: Secondary | ICD-10-CM

## 2013-08-15 DIAGNOSIS — A088 Other specified intestinal infections: Secondary | ICD-10-CM

## 2013-08-15 MED ORDER — GI COCKTAIL ~~LOC~~
ORAL | Status: AC
  Filled 2013-08-15: qty 30

## 2013-08-15 MED ORDER — ONDANSETRON 8 MG PO TBDP: 8.0000 mg | ORAL_TABLET | Freq: Three times a day (TID) | ORAL | Status: DC | PRN

## 2013-08-15 MED ORDER — GI COCKTAIL ~~LOC~~
30.0000 mL | Freq: Once | ORAL | Status: AC
Start: 2013-08-15 — End: 2013-08-15
  Administered 2013-08-15: 30 mL via ORAL

## 2013-08-15 MED ORDER — ONDANSETRON 4 MG PO TBDP
8.0000 mg | ORAL_TABLET | Freq: Once | ORAL | Status: AC
  Administered 2013-08-15: 8 mg via ORAL

## 2013-08-15 MED ORDER — ONDANSETRON 4 MG PO TBDP
ORAL_TABLET | ORAL | Status: AC
  Filled 2013-08-15: qty 2

## 2013-08-15 MED ORDER — DIPHENOXYLATE-ATROPINE 2.5-0.025 MG PO TABS: 1.0000 | ORAL_TABLET | Freq: Four times a day (QID) | ORAL | Status: DC | PRN

## 2013-08-15 NOTE — Discharge Instructions (Signed)

## 2013-08-15 NOTE — ED Notes (Signed)
C/o nausea and diarrhea.  On set yesterday.  Only two episodes one in the morning and one in the afternoon.  Low grade temp of 99.  pepto taken this morning.

## 2013-08-15 NOTE — ED Provider Notes (Signed)
Chief Complaint   Chief Complaint  Patient presents with  . Nausea  . Diarrhea     History of Present Illness   Virginia Beltran is a 28 year old female who's had a history since yesterday of nausea, vomiting, and diarrhea. She has had a total of 2 episodes of emesis, and today she is having diarrhea about every one hour. No hematemesis, coffee-ground emesis, or bilious emesis. No hematochezia or melena. She has crampy midabdominal pain. She's had a temperature of up to 99, and she feels somewhat dizzy. She's had no sick exposures. She ate at [facility] [employer] right before the symptoms began and thought her food didn't quite taste right. No foreign travel.  Review of Systems   Other than as noted above, the patient denies any of the following symptoms: Systemic:  No fevers, chills, sweats, weight loss or gain, fatigue, or tiredness. ENT:  No nasal congestion, rhinorrhea, or sore throat. Lungs:  No cough, wheezing, or shortness of breath. Cardiac:  No chest pain, syncope, or presyncope. GI:  No abdominal pain, nausea, vomiting, anorexia, diarrhea, constipation, blood in stool or vomitus. GU:  No dysuria, frequency, or urgency.  PMFSH   Past medical history, family history, social history, meds, and allergies were reviewed.  She is allergic to penicillin. She has a history of asthma.  Physical Exam     Vital signs:  BP 113/73  Pulse 83  Temp(Src) 98.3 F (36.8 C) (Oral)  Resp 16  SpO2 100%  LMP 08/08/2013 Filed Vitals:   08/15/13 0910 08/15/13 0935 Supine 08/15/13 0935 Sitting  08/15/13 0935 Standing   BP: 117/74 109/74 109/74 113/73  Pulse: 66 69 71 83  Temp: 98.3 F (36.8 C)     TempSrc: Oral     Resp: 16     SpO2: 100%      General:  Alert and oriented.  In no distress.  Skin warm and dry.  Good skin turgor, brisk capillary refill. ENT:  No scleral icterus, moist mucous membranes, no oral lesions, pharynx clear. Lungs:  Breath sounds clear and equal bilaterally.   No wheezes, rales, or rhonchi. Heart:  Rhythm regular, without extrasystoles.  No gallops or murmers. Abdomen:  Soft, flat, nondistended. No organomegaly or mass. Bowel sounds are hyperactive. There is mild, periumbilical tenderness to palpation without guarding or rebound.  Skin: Clear, warm, and dry.  Good turgor.  Brisk capillary refill.  Course in [facility]   She was given Zofran ODT 8 mg sublingually and 30 cc of GI cocktail. Thereafter her abdominal pain went away completely. She was able to tolerate some ginger ale.   Assessment   The encounter diagnosis was Viral gastroenteritis.  Probable Norovirus infection versus food poisoning. No evidence of dehydration.  Plan   1.  Meds:  The following meds were prescribed:   New Prescriptions   DIPHENOXYLATE-ATROPINE (LOMOTIL) 2.5-0.025 MG PER TABLET    Take 1 tablet by mouth 4 (four) times daily as needed for diarrhea or loose stools.   ONDANSETRON (ZOFRAN ODT) 8 MG DISINTEGRATING TABLET    Take 1 tablet (8 mg total) by mouth every 8 (eight) hours as needed for nausea.    2.  Patient Education/Counseling:  The patient was given appropriate handouts, self care instructions, and instructed in symptomatic relief. The patient was told to stay on clear liquids for the remainder of the day, then advance to a B.R.A.T. diet starting tomorrow.   3.  Follow up:  The patient was  told to follow up here if no better in 2 to 3 days, or sooner if becoming worse in any way, and given some red flag symptoms such as persistent vomitng, high fever, severe abdominal pain, or any GI bleeding which would prompt immediate return.         Reuben Likesavid C Andre Swander, MD 08/15/13 1019

## 2014-04-10 IMAGING — US US OB COMP LESS 14 WK
1 series · 14 of 22 positions shown · non-contrast
Comparison: Previous examinations, the most recent dated
01/12/2006.

CLINICAL DATA: Pelvic pain.  9 weeks of 3 days pregnant by last
menstrual period.

OBSTETRIC <14 WK ULTRASOUND
TECHNIQUE: Transabdominal ultrasound was performed for evaluation
of the gestation as well as the maternal uterus and adnexal
regions.

[Series 1: us ob comp less 14 wks · 14 of 22 slices shown]
[im 1/22]
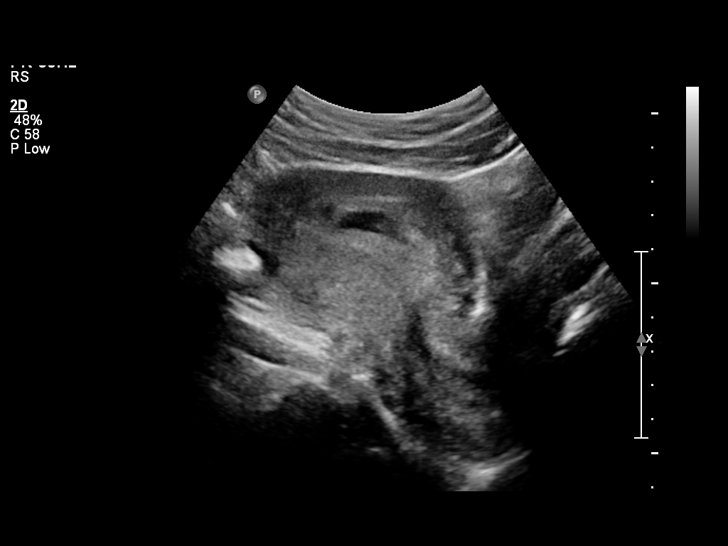
[im 3/22]
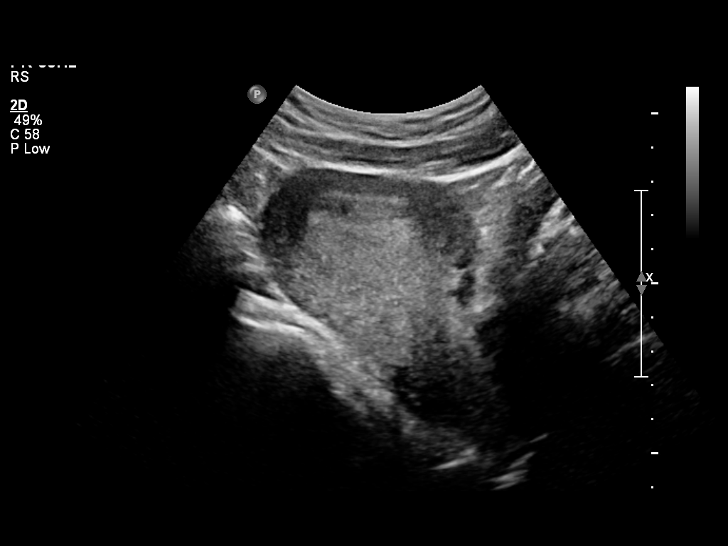
[im 4/22]
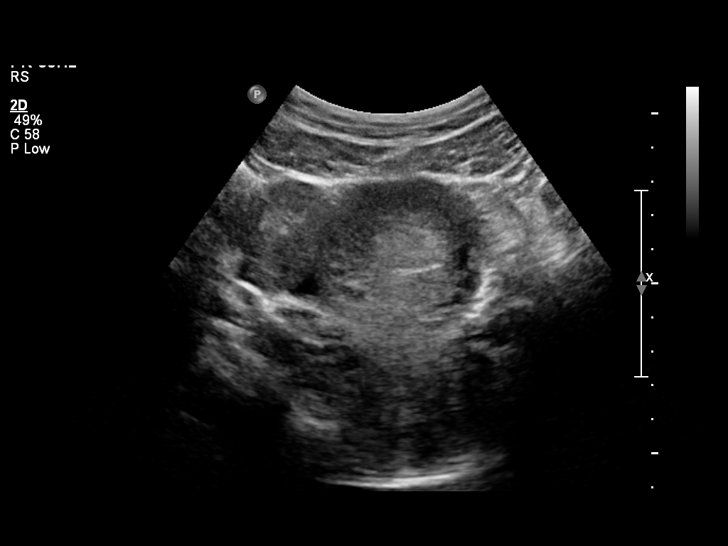
[im 6/22]
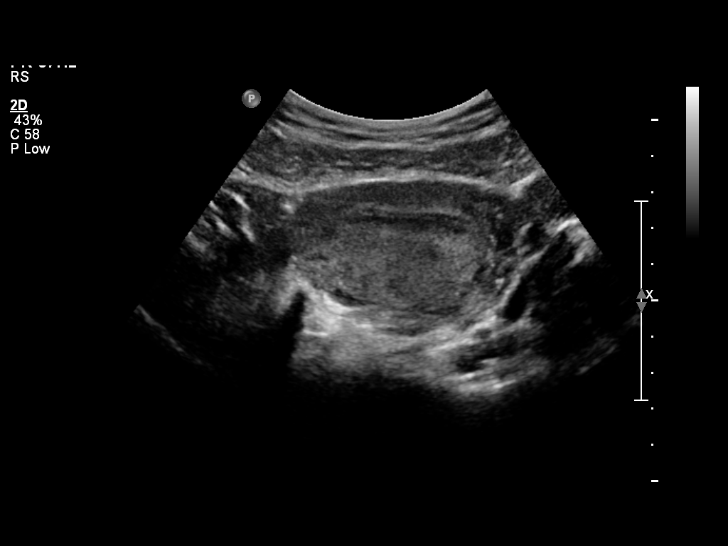
[im 8/22]
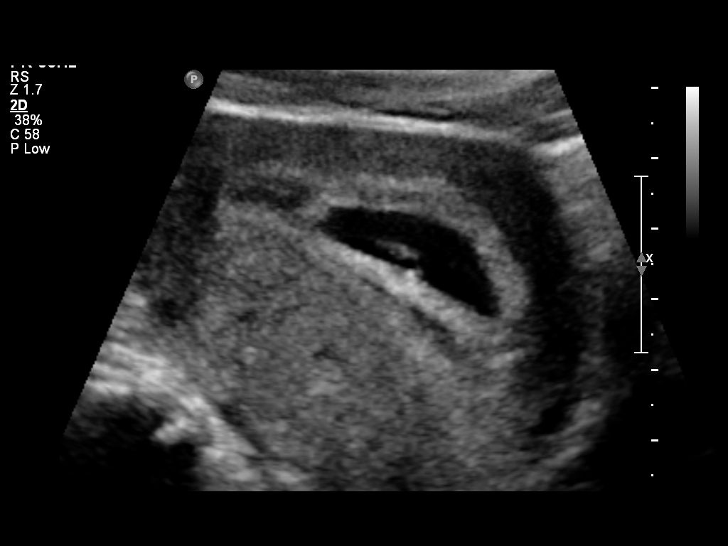
[im 9/22]
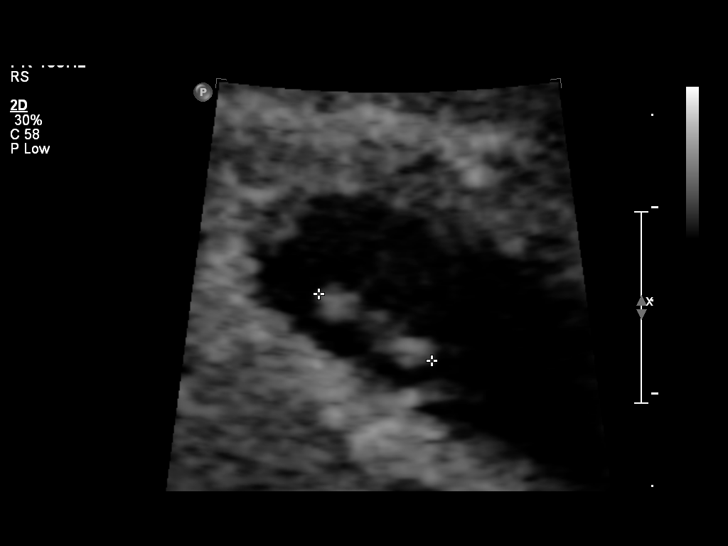
[im 11/22]
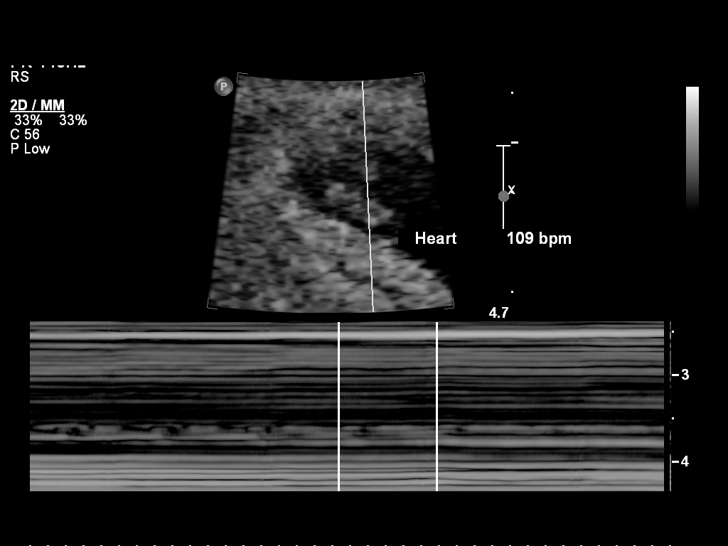
[im 12/22]
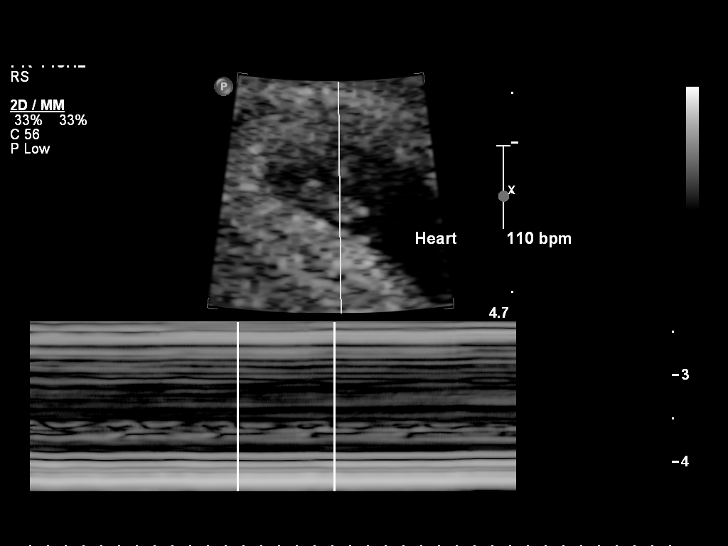
[im 14/22]
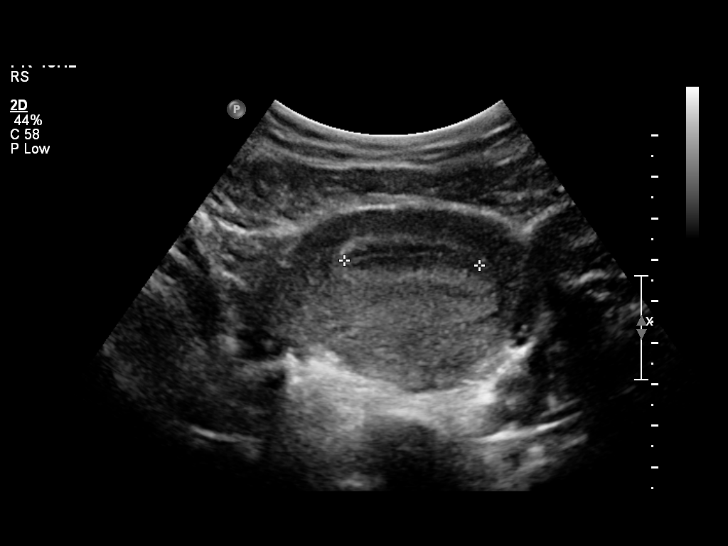
[im 15/22]
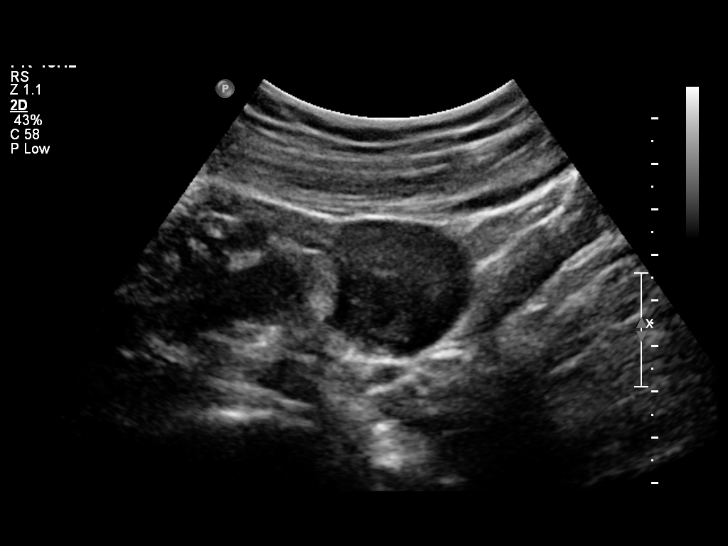
[im 17/22]
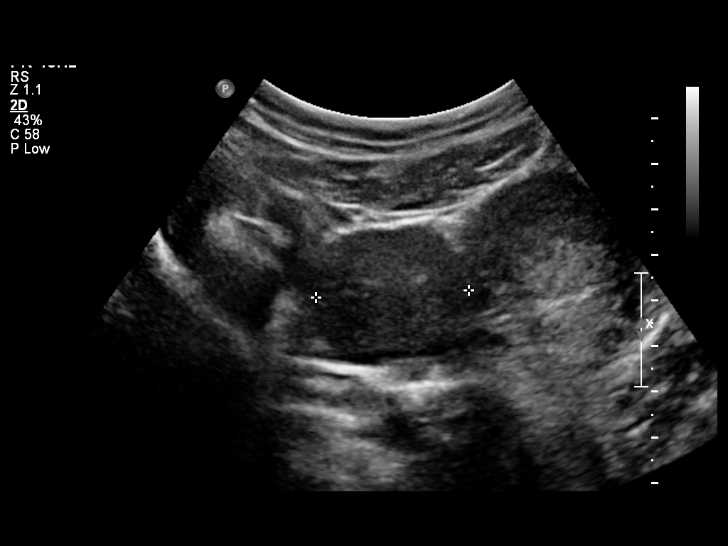
[im 19/22]
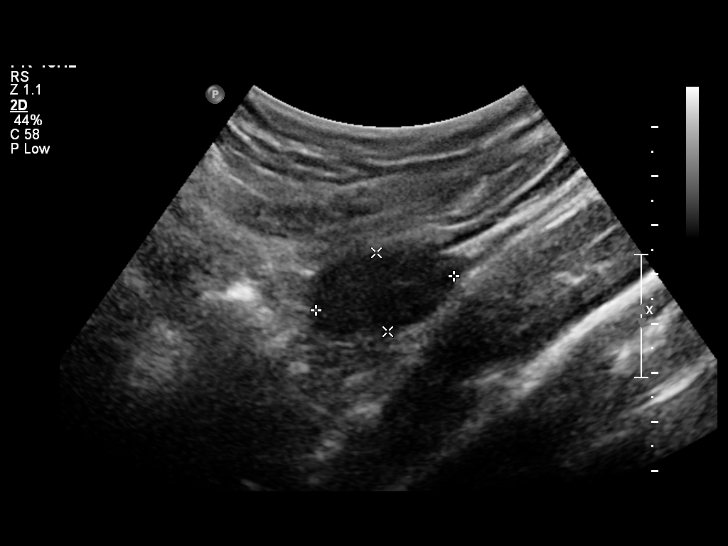
[im 20/22]
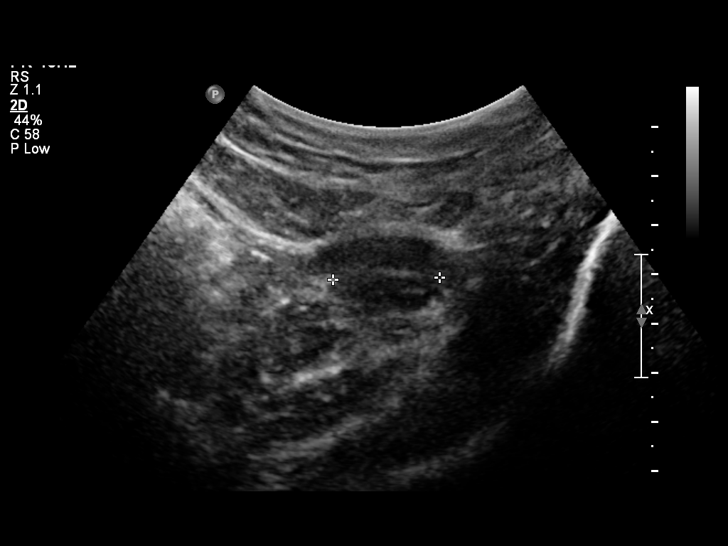
[im 22/22]
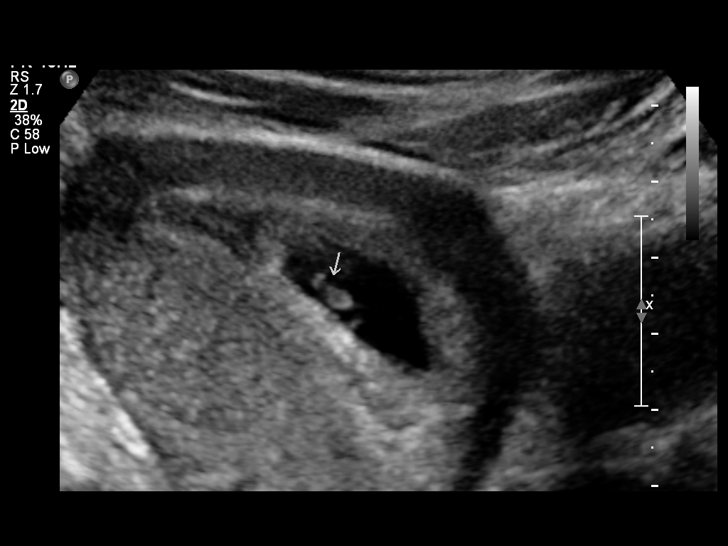

[14 of 22 positions shown; findings below may reference images not displayed]

Intrauterine gestational sac: Visualized/normal in shape.
Yolk sac: Suboptimally visualized due to the lack of transvaginal
technique.  Suggestion of a small yolk sac.
Embryo: Visualized
Cardiac Activity: Visualized
Heart Rate: 110 bpm

CRL:  7.1 mm  6 w  5 d        US EDC: 10/26/2012

Maternal uterus/Adnexae:
Normal appearing maternal ovaries with a corpus luteum on the
right.  Small subchorionic hemorrhage.  No free peritoneal fluid.
IMPRESSION: 1.  Single live intrauterine gestation with an estimated
gestational age by today's measurements of 6 weeks and 5 days.
2.  Small subchorionic hemorrhage.

## 2014-05-01 ENCOUNTER — Encounter (HOSPITAL_COMMUNITY): Payer: Self-pay | Admitting: Emergency Medicine

## 2014-11-26 ENCOUNTER — Encounter (HOSPITAL_COMMUNITY): Payer: Self-pay | Admitting: *Deleted

## 2014-11-26 ENCOUNTER — Emergency Department (INDEPENDENT_AMBULATORY_CARE_PROVIDER_SITE_OTHER)
Admission: EM | Admit: 2014-11-26 | Discharge: 2014-11-26 | Disposition: A | Discharge status: Home / Self Care | Payer: Self-pay | Source: Home / Self Care | Attending: Emergency Medicine | Admitting: Emergency Medicine

## 2014-11-26 ENCOUNTER — Other Ambulatory Visit (HOSPITAL_COMMUNITY)
Admission: RE | Admit: 2014-11-26 | Discharge: 2014-11-26 | Disposition: A | Discharge status: Home / Self Care | Payer: Self-pay | Source: Ambulatory Visit | Attending: Emergency Medicine | Admitting: Emergency Medicine

## 2014-11-26 DIAGNOSIS — Z113 Encounter for screening for infections with a predominantly sexual mode of transmission: Secondary | ICD-10-CM | POA: Insufficient documentation

## 2014-11-26 DIAGNOSIS — N760 Acute vaginitis: Secondary | ICD-10-CM

## 2014-11-26 DIAGNOSIS — A499 Bacterial infection, unspecified: Secondary | ICD-10-CM

## 2014-11-26 DIAGNOSIS — B9689 Other specified bacterial agents as the cause of diseases classified elsewhere: Secondary | ICD-10-CM

## 2014-11-26 LAB — POCT URINALYSIS DIP (DEVICE)
Bilirubin Urine: NEGATIVE
GLUCOSE, UA: NEGATIVE mg/dL
KETONES UR: NEGATIVE mg/dL
Leukocytes, UA: NEGATIVE
Nitrite: NEGATIVE
PROTEIN: NEGATIVE mg/dL
SPECIFIC GRAVITY, URINE: 1.02 (ref 1.005–1.030)
Urobilinogen, UA: 0.2 mg/dL (ref 0.0–1.0)
pH: 7 (ref 5.0–8.0)

## 2014-11-26 LAB — POCT PREGNANCY, URINE: Preg Test, Ur: NEGATIVE

## 2014-11-26 MED ORDER — METRONIDAZOLE 500 MG PO TABS: 500.0000 mg | ORAL_TABLET | Freq: Two times a day (BID) | ORAL | Status: DC

## 2014-11-26 NOTE — Discharge Instructions (Signed)
Bacterial Vaginosis Bacterial vaginosis is a vaginal infection that occurs when the normal balance of bacteria in the vagina is disrupted. It results from an overgrowth of certain bacteria. This is the most common vaginal infection in women of childbearing age. Treatment is important to prevent complications, especially in pregnant women, as it can cause a premature delivery. CAUSES  Bacterial vaginosis is caused by an increase in harmful bacteria that are normally present in smaller amounts in the vagina. Several different kinds of bacteria can cause bacterial vaginosis. However, the reason that the condition develops is not fully understood. RISK FACTORS Certain activities or behaviors can put you at an increased risk of developing bacterial vaginosis, including:  Having a new sex partner or multiple sex partners.  Douching.  Using an intrauterine device (IUD) for contraception. Women do not get bacterial vaginosis from toilet seats, bedding, swimming pools, or contact with objects around them. SIGNS AND SYMPTOMS  Some women with bacterial vaginosis have no signs or symptoms. Common symptoms include:  Grey vaginal discharge.  A fishlike odor with discharge, especially after sexual intercourse.  Itching or burning of the vagina and vulva.  Burning or pain with urination. DIAGNOSIS  Your health care provider will take a medical history and examine the vagina for signs of bacterial vaginosis. A sample of vaginal fluid may be taken. Your health care provider will look at this sample under a microscope to check for bacteria and abnormal cells. A vaginal pH test may also be done.  TREATMENT  Bacterial vaginosis may be treated with antibiotic medicines. These may be given in the form of a pill or a vaginal cream. A second round of antibiotics may be prescribed if the condition comes back after treatment.  HOME CARE INSTRUCTIONS   Only take over-the-counter or prescription medicines as  directed by your health care provider.  If antibiotic medicine was prescribed, take it as directed. Make sure you finish it even if you start to feel better.  Do not have sex until treatment is completed.  Tell all sexual partners that you have a vaginal infection. They should see their health care provider and be treated if they have problems, such as a mild rash or itching.  Practice safe sex by using condoms and only having one sex partner. SEEK MEDICAL CARE IF:   Your symptoms are not improving after 3 days of treatment.  You have increased discharge or pain.  You have a fever. MAKE SURE YOU:   Understand these instructions.  Will watch your condition.  Will get help right away if you are not doing well or get worse. FOR MORE INFORMATION  Centers for Disease Control and Prevention, Division of STD Prevention: www.cdc.gov/std American Sexual Health Association (ASHA): www.ashastd.org  Document Released: 06/16/2005 Document Revised: 04/06/2013 Document Reviewed: 01/26/2013 ExitCare Patient Information 2015 [employer]. This information is not intended to replace advice given to you by your health care provider. Make sure you discuss any questions you have with your health care provider.  

## 2014-11-26 NOTE — ED Provider Notes (Signed)
CSN: [id number]     Arrival date & time 11/26/14  1240 History   First MD Initiated Contact with Patient 11/26/14 1350     Chief Complaint  Patient presents with  . Vaginal Discharge  . Abdominal Pain   (Consider location/radiation/quality/duration/timing/severity/associated sxs/prior Treatment) HPI  She is a 29 year old woman here for evaluation of vaginal discharge. She states she noticed a white discharge about 3 days ago. Was associated with a foul odor. This started after her cycle ended. She states the discharge is maybe improved a little bit. No fevers or chills. No nausea or vomiting. She does report some mild lower abdominal cramps. She is sexually active with one partner. They do not use condoms.  No urinary symptoms.  Past Medical History  Diagnosis Date  . BV (bacterial vaginosis)   . Chlamydia   . PID (acute pelvic inflammatory disease)   . Ovarian cyst   . Yeast vaginitis   . PONV (postoperative nausea and vomiting)     nausea  . Asthma    Past Surgical History  Procedure Laterality Date  . Wisdom tooth extraction     Family History  Problem Relation Age of Onset  . Other Neg Hx   . Hyperlipidemia Other   . Hypertension Other    History  Substance Use Topics  . Smoking status: Former Smoker -- 1 years    Types: Cigars    Quit date: 03/02/2012  . Smokeless tobacco: Never Used  . Alcohol Use: No     Comment: occasional   OB History    Gravida Para Term Preterm AB TAB SAB Ectopic Multiple Living   2 2 2       2      Review of Systems As in history of present illness Allergies  Penicillins and Shellfish allergy  Home Medications   Prior to Admission medications   Medication Sig Start Date End Date Taking? Authorizing Provider  metroNIDAZOLE (FLAGYL) 500 MG tablet Take 1 tablet (500 mg total) by mouth 2 (two) times daily. 11/26/14   Charm RingsErin J Esmee Fallaw, MD   BP 133/83 mmHg  Pulse 62  Temp(Src) 98.8 F (37.1 C) (Oral)  Resp 16  SpO2 99%  LMP 11/16/2014  (Approximate) Physical Exam  Constitutional: She is oriented to person, place, and time. She appears well-developed and well-nourished. No distress.  Cardiovascular: Normal rate.   Pulmonary/Chest: Effort normal.  Genitourinary: There is no rash on the right labia. There is no rash on the left labia. Cervix exhibits no motion tenderness and no discharge. Right adnexum displays no mass and no tenderness. Left adnexum displays no mass and no tenderness. No tenderness in the vagina. No foreign body around the vagina. Vaginal discharge: white.  Neurological: She is alert and oriented to person, place, and time.    ED Course  Procedures (including critical care time) Labs Review Labs Reviewed  POCT URINALYSIS DIP (DEVICE) - Abnormal; Notable for the following:    Hgb urine dipstick TRACE (*)    All other components within normal limits  HIV ANTIBODY (ROUTINE TESTING)  RPR  POCT PREGNANCY, URINE  CERVICOVAGINAL ANCILLARY ONLY    Imaging Review No results found.   MDM   1. BV (bacterial vaginosis)    STD testing collected. We'll treat with Flagyl. Follow-up as needed.    Charm RingsErin J Rena Sweeden, MD 11/26/14 [phone]

## 2014-11-26 NOTE — ED Notes (Signed)
C/O vaginal discharge with foul odor x 2 days - denies hx.  Today started with slight low abd discomfort.  Denies fevers, n/v/d.

## 2014-11-27 LAB — RPR: RPR Ser Ql: NONREACTIVE

## 2014-11-27 LAB — HIV ANTIBODY (ROUTINE TESTING W REFLEX): HIV SCREEN 4TH GENERATION: NONREACTIVE

## 2014-11-28 LAB — CERVICOVAGINAL ANCILLARY ONLY
Chlamydia: NEGATIVE
Neisseria Gonorrhea: NEGATIVE
WET PREP (BD AFFIRM): POSITIVE — AB

## 2014-12-04 NOTE — ED Notes (Signed)
Negative for GC, negative for chlamydia. Treated adequately w Rx for Flagyl on day of visit

## 2016-08-22 ENCOUNTER — Ambulatory Visit (HOSPITAL_COMMUNITY)
Admission: EM | Admit: 2016-08-22 | Discharge: 2016-08-22 | Disposition: A | Discharge status: Home / Self Care | Payer: Self-pay | Attending: Family Medicine | Admitting: Family Medicine

## 2016-08-22 ENCOUNTER — Encounter (HOSPITAL_COMMUNITY): Payer: Self-pay | Admitting: Family Medicine

## 2016-08-22 DIAGNOSIS — J40 Bronchitis, not specified as acute or chronic: Secondary | ICD-10-CM

## 2016-08-22 DIAGNOSIS — R05 Cough: Secondary | ICD-10-CM

## 2016-08-22 DIAGNOSIS — R059 Cough, unspecified: Secondary | ICD-10-CM

## 2016-08-22 MED ORDER — AZITHROMYCIN 250 MG PO TABS: 250.0000 mg | ORAL_TABLET | Freq: Every day | ORAL | 0 refills | Status: DC

## 2016-08-22 MED ORDER — BENZONATATE 100 MG PO CAPS: 100.0000 mg | ORAL_CAPSULE | Freq: Three times a day (TID) | ORAL | 0 refills | Status: DC

## 2016-08-22 MED ORDER — ALBUTEROL SULFATE HFA 108 (90 BASE) MCG/ACT IN AERS
1.0000 | INHALATION_SPRAY | Freq: Four times a day (QID) | RESPIRATORY_TRACT | 0 refills | Status: AC | PRN
Start: 2016-08-22 — End: ?

## 2016-08-22 NOTE — ED Provider Notes (Signed)
CSN: [id number]     Arrival date & time 08/22/16  1012 History   None    Chief Complaint  Patient presents with  . Cough  . Nasal Congestion   (Consider location/radiation/quality/duration/timing/severity/associated sxs/prior Treatment) Patient c/o URI sx's for 2 weeks.     The history is provided by the patient.  Cough  Cough characteristics:  Productive Sputum characteristics:  White Severity:  Moderate Onset quality:  Gradual Duration:  2 weeks Timing:  Intermittent Chronicity:  New Smoker: no   Context: upper respiratory infection and weather changes   Relieved by:  Nothing Worsened by:  Nothing Ineffective treatments:  None tried   Past Medical History:  Diagnosis Date  . Asthma   . BV (bacterial vaginosis)   . Chlamydia   . Ovarian cyst   . PID (acute pelvic inflammatory disease)   . PONV (postoperative nausea and vomiting)    nausea  . Yeast vaginitis    Past Surgical History:  Procedure Laterality Date  . WISDOM TOOTH EXTRACTION     Family History  Problem Relation Age of Onset  . Hyperlipidemia Other   . Hypertension Other   . Other Neg Hx    Social History  Substance Use Topics  . Smoking status: Former Smoker    Years: 1.00    Types: Cigars    Quit date: 03/02/2012  . Smokeless tobacco: Never Used  . Alcohol use No     Comment: occasional   OB History    Gravida Para Term Preterm AB Living   2 2 2     2    SAB TAB Ectopic Multiple Live Births           2     Review of Systems  Constitutional: Positive for fatigue.  HENT: Positive for congestion.   Eyes: Negative.   Respiratory: Positive for cough.   Cardiovascular: Negative.   Gastrointestinal: Negative.   Endocrine: Negative.   Genitourinary: Negative.   Musculoskeletal: Negative.   Allergic/Immunologic: Negative.   Neurological: Negative.   Hematological: Negative.     Allergies  Penicillins and Shellfish allergy  Home Medications   Prior to Admission medications    Medication Sig Start Date End Date Taking? Authorizing Provider  albuterol (PROVENTIL HFA;VENTOLIN HFA) 108 (90 Base) MCG/ACT inhaler Inhale 1-2 puffs into the lungs every 6 (six) hours as needed for wheezing or shortness of breath. 08/22/16   Deatra CanterWilliam J Janisha Bueso, FNP  azithromycin (ZITHROMAX) 250 MG tablet Take 1 tablet (250 mg total) by mouth daily. Take first 2 tablets together, then 1 every day until finished. 08/22/16   Deatra CanterWilliam J Eyal Greenhaw, FNP  benzonatate (TESSALON) 100 MG capsule Take 1 capsule (100 mg total) by mouth every 8 (eight) hours. 08/22/16   Deatra CanterWilliam J Laurier Jasperson, FNP   Meds Ordered and Administered this Visit  Medications - No data to display  BP 137/83   Pulse 68   Temp 98.5 F (36.9 C)   Resp 18   SpO2 100%  No data found.   Physical Exam  Constitutional: She appears well-developed and well-nourished.  HENT:  Head: Normocephalic and atraumatic.  Right Ear: External ear normal.  Left Ear: External ear normal.  Mouth/Throat: Oropharynx is clear and moist.  Eyes: Conjunctivae and EOM are normal. Pupils are equal, round, and reactive to light.  Neck: Normal range of motion.  Cardiovascular: Normal rate, regular rhythm and normal heart sounds.   Pulmonary/Chest: Effort normal and breath sounds normal.  Abdominal: Soft.  Nursing note  and vitals reviewed.   Urgent Care Course     Procedures (including critical care time)  Labs Review Labs Reviewed - No data to display  Imaging Review No results found.   Visual Acuity Review  Right Eye Distance:   Left Eye Distance:   Bilateral Distance:    Right Eye Near:   Left Eye Near:    Bilateral Near:         MDM   1. Bronchitis   2. Cough    Zpak Tessalon Perles Albuterol MDI  Push po fluids, rest, tylenol and motrin otc prn as directed for fever, arthralgias, and myalgias.  Follow up prn if sx's continue or persist.      Deatra Canter, FNP 08/22/16 1146

## 2016-08-22 NOTE — ED Triage Notes (Signed)
Pt here for 2 weeks of URI symptoms.  

## 2017-03-13 ENCOUNTER — Encounter (HOSPITAL_COMMUNITY): Payer: Self-pay | Admitting: Emergency Medicine

## 2017-03-13 ENCOUNTER — Ambulatory Visit (HOSPITAL_COMMUNITY)
Admission: EM | Admit: 2017-03-13 | Discharge: 2017-03-13 | Disposition: A | Discharge status: Home / Self Care | Payer: Medicaid Other | Attending: Family Medicine | Admitting: Family Medicine

## 2017-03-13 DIAGNOSIS — T148XXA Other injury of unspecified body region, initial encounter: Secondary | ICD-10-CM | POA: Diagnosis not present

## 2017-03-13 MED ORDER — NAPROXEN 500 MG PO TABS: 500.0000 mg | ORAL_TABLET | Freq: Two times a day (BID) | ORAL | 0 refills | Status: AC

## 2017-03-13 MED ORDER — CYCLOBENZAPRINE HCL 10 MG PO TABS: 5.0000 mg | ORAL_TABLET | Freq: Every evening | ORAL | 0 refills | Status: DC | PRN

## 2017-03-13 NOTE — ED Triage Notes (Signed)
Pt reports she pulled her back muscles 2 days ago while pushing her daughter on a scooter  Pain increases w/activity  A&O x4... NAD... Ambulatory

## 2017-03-13 NOTE — Discharge Instructions (Signed)
Your exam was most consistent with muscle strain. Start Naproxen as directed. Flexeril as needed at night. Flexeril can make you drowsy, so do not take if you are going to drive, operate heavy machinery, or make important decisions. Ice/heat compresses as needed. This can take up to 3-4 weeks to completely resolve, but you should be feeling better each week. Follow up here or with PCP if symptoms worsen, changes for reevaluation. If experience numbness/tingling of the inner thighs, loss of bladder or bowel control, go to the emergency department for evaluation.  ° °

## 2017-03-13 NOTE — ED Provider Notes (Signed)
[facility]    CSN: [id number] Arrival date & time: 03/13/17  1320     History   Chief Complaint Chief Complaint  Patient presents with  . Back Pain    HPI Virginia Beltran is a 31 y.o. female.   31 year old female comes in for 2 day history of mid back pain. She states the pain started when she was pushing her daughter in a stroller. States pain wasn't too bad the first night, but has since gotten worse. Pain is constant, and worse with movement. She has also experienced some muscle spasm/cramps. She has tried a dose of ibuprofen and used icy hot with minimal relief. Denies numbness, tingling, loss of bladder or bowel control.       Past Medical History:  Diagnosis Date  . Asthma   . BV (bacterial vaginosis)   . Chlamydia   . Ovarian cyst   . PID (acute pelvic inflammatory disease)   . PONV (postoperative nausea and vomiting)    nausea  . Yeast vaginitis     Patient Active Problem List   Diagnosis Date Noted  . Postpartum care and examination of lactating mother 11/11/2012  . Asthma 09/27/2012    Past Surgical History:  Procedure Laterality Date  . WISDOM TOOTH EXTRACTION      OB History    Gravida Para Term Preterm AB Living   SAB TAB Ectopic Multiple Live Births           2       Home Medications    Prior to Admission medications   Medication Sig Start Date End Date Taking? Authorizing Provider  albuterol (PROVENTIL HFA;VENTOLIN HFA) 108 (90 Base) MCG/ACT inhaler Inhale 1-2 puffs into the lungs every 6 (six) hours as needed for wheezing or shortness of breath. 08/22/16   Deatra Canter, FNP  azithromycin (ZITHROMAX) 250 MG tablet Take 1 tablet (250 mg total) by mouth daily. Take first 2 tablets together, then 1 every day until finished. 08/22/16   Deatra Canter, FNP  benzonatate (TESSALON) 100 MG capsule Take 1 capsule (100 mg total) by mouth every 8 (eight) hours. 08/22/16   Deatra Canter, FNP  cyclobenzaprine  (FLEXERIL) 10 MG tablet Take 0.5-1 tablets (5-10 mg total) by mouth at bedtime as needed for muscle spasms. 03/13/17   Cathie Hoops, Amy V, PA-C  naproxen (NAPROSYN) 500 MG tablet Take 1 tablet (500 mg total) by mouth 2 (two) times daily. 03/13/17 03/23/17  Belinda Fisher, PA-C    Family History Family History  Problem Relation Age of Onset  . Hyperlipidemia Other   . Hypertension Other   . Other Neg Hx     Social History Social History  Substance Use Topics  . Smoking status: Former Smoker    Years: 1.00    Types: Cigars    Quit date: 03/02/2012  . Smokeless tobacco: Never Used  . Alcohol use No     Comment: occasional     Allergies   Penicillins and Shellfish allergy   Review of Systems Review of Systems  Reason unable to perform ROS: See HPI as above.     Physical Exam Triage Vital Signs ED Triage Vitals [03/13/17 1435]  Enc Vitals Group     BP 129/81     Pulse Rate 84     Resp 20     Temp 98.5 F (36.9 C)     Temp Source  Oral     SpO2 100 %     Weight      Height      Head Circumference      Peak Flow      Pain Score 8     Pain Loc      Pain Edu?      Excl. in GC?    No data found.   Updated Vital Signs BP 129/81 (BP Location: Left Arm)   Pulse 84   Temp 98.5 F (36.9 C) (Oral)   Resp 20   LMP 02/13/2017   SpO2 100%   Visual Acuity Right Eye Distance:   Left Eye Distance:   Bilateral Distance:    Right Eye Near:   Left Eye Near:    Bilateral Near:     Physical Exam  Constitutional: She is oriented to person, place, and time. She appears well-developed and well-nourished. No distress.  HENT:  Head: Normocephalic and atraumatic.  Eyes: Pupils are equal, round, and reactive to light. Conjunctivae are normal.  Cardiovascular: Normal rate, regular rhythm and normal heart sounds.  Exam reveals no gallop and no friction rub.   No murmur heard. Pulmonary/Chest: Effort normal and breath sounds normal. She has no wheezes. She has no rales.  Musculoskeletal:    No tenderness on palpation of the spinous processes. Tenderness on palpation of mid back, below the scapula. No tenderness on palpation of hips Full range of motion of back and hips, though with pain of mid back. Strength normal and equal bilaterally. Sensation intact and equal bilaterally.  Neurological: She is alert and oriented to person, place, and time.  Skin: Skin is warm and dry.     UC Treatments / Results  Labs (all labs ordered are listed, but only abnormal results are displayed) Labs Reviewed - No data to display  EKG  EKG Interpretation None       Radiology No results found.  Procedures Procedures (including critical care time)  Medications Ordered in UC Medications - No data to display   Initial Impression / Assessment and Plan / UC Course  I have reviewed the triage vital signs and the nursing notes.  Pertinent labs & imaging results that were available during my care of the patient were reviewed by me and considered in my medical decision making (see chart for details).    Discussed with patient history and exam most consistent with muscle strain. Start NSAID as directed for pain and inflammation. Muscle relaxant as needed. Ice/heat compresses. Discussed with patient strain can take up to 3-4 weeks to resolve, but should be getting better each week. Return precautions given.    Final Clinical Impressions(s) / UC Diagnoses   Final diagnoses:  Muscle strain    New Prescriptions New Prescriptions   CYCLOBENZAPRINE (FLEXERIL) 10 MG TABLET    Take 0.5-1 tablets (5-10 mg total) by mouth at bedtime as needed for muscle spasms.   NAPROXEN (NAPROSYN) 500 MG TABLET    Take 1 tablet (500 mg total) by mouth 2 (two) times daily.      Belinda Fisher, PA-C 03/13/17 [phone]

## 2018-02-07 ENCOUNTER — Encounter (HOSPITAL_COMMUNITY): Payer: Self-pay | Admitting: Emergency Medicine

## 2018-02-07 ENCOUNTER — Ambulatory Visit (HOSPITAL_COMMUNITY)
Admission: EM | Admit: 2018-02-07 | Discharge: 2018-02-07 | Disposition: A | Discharge status: Home / Self Care | Payer: 59 | Attending: Family Medicine | Admitting: Family Medicine

## 2018-02-07 DIAGNOSIS — Z113 Encounter for screening for infections with a predominantly sexual mode of transmission: Secondary | ICD-10-CM | POA: Diagnosis not present

## 2018-02-07 DIAGNOSIS — Z87891 Personal history of nicotine dependence: Secondary | ICD-10-CM | POA: Diagnosis not present

## 2018-02-07 DIAGNOSIS — Z79899 Other long term (current) drug therapy: Secondary | ICD-10-CM | POA: Insufficient documentation

## 2018-02-07 DIAGNOSIS — J45909 Unspecified asthma, uncomplicated: Secondary | ICD-10-CM | POA: Insufficient documentation

## 2018-02-07 DIAGNOSIS — R109 Unspecified abdominal pain: Secondary | ICD-10-CM | POA: Diagnosis present

## 2018-02-07 DIAGNOSIS — N760 Acute vaginitis: Secondary | ICD-10-CM | POA: Diagnosis not present

## 2018-02-07 LAB — POCT URINALYSIS DIP (DEVICE)
GLUCOSE, UA: NEGATIVE mg/dL
KETONES UR: NEGATIVE mg/dL
Leukocytes, UA: NEGATIVE
Nitrite: NEGATIVE
PROTEIN: NEGATIVE mg/dL
SPECIFIC GRAVITY, URINE: 1.025 (ref 1.005–1.030)
Urobilinogen, UA: 0.2 mg/dL (ref 0.0–1.0)
pH: 6 (ref 5.0–8.0)

## 2018-02-07 LAB — POCT PREGNANCY, URINE: PREG TEST UR: NEGATIVE

## 2018-02-07 MED ORDER — METRONIDAZOLE 500 MG PO TABS: 500.0000 mg | ORAL_TABLET | Freq: Two times a day (BID) | ORAL | 0 refills | Status: AC

## 2018-02-07 NOTE — ED Triage Notes (Signed)
Pt reports lower abdominal cramping and vaginal d/c x4 days.  She denies any other urinary symptoms.

## 2018-02-07 NOTE — ED Provider Notes (Signed)
[facility]    CSN: [id number] Arrival date & time: 02/07/18  1002     History   Chief Complaint Chief Complaint  Patient presents with  . Abdominal Pain  . Vaginal Discharge    HPI Virginia Beltran is a 32 y.o. female.   Virginia Beltran presents with complaints of vaginal discharge with odor which started approximately 3 days ago. Initially had some sharp abdominal pains which have resolved. Some pelvic cramping but just started her period. She has irregular periods due to implant but feels her last period was approximately 1 month ago. No urinary symptoms. No back pain. Has not taken any medications for symptoms. Denies any previous similar. No fevers. Sexually active with 1 partner, does not use condoms. Partner recently told her he has been with another partner as well. No specific known std exposure but she is concerned. Hx of bv, chlamydia, PID.    ROS per HPI.      Past Medical History:  Diagnosis Date  . Asthma   . BV (bacterial vaginosis)   . Chlamydia   . Ovarian cyst   . PID (acute pelvic inflammatory disease)   . PONV (postoperative nausea and vomiting)    nausea  . Yeast vaginitis     Patient Active Problem List   Diagnosis Date Noted  . Postpartum care and examination of lactating mother 11/11/2012  . Asthma 09/27/2012    Past Surgical History:  Procedure Laterality Date  . WISDOM TOOTH EXTRACTION      OB History    Gravida  2   Para  2   Term  2   Preterm      AB      Living  2     SAB      TAB      Ectopic      Multiple      Live Births  2            Home Medications    Prior to Admission medications   Medication Sig Start Date End Date Taking? Authorizing Provider  albuterol (PROVENTIL HFA;VENTOLIN HFA) 108 (90 Base) MCG/ACT inhaler Inhale 1-2 puffs into the lungs every 6 (six) hours as needed for wheezing or shortness of breath. 08/22/16   Deatra Canter, FNP  metroNIDAZOLE (FLAGYL) 500 MG tablet Take 1  tablet (500 mg total) by mouth 2 (two) times daily for 7 days. 02/07/18 02/14/18  Georgetta Haber, NP    Family History Family History  Problem Relation Age of Onset  . Hyperlipidemia Other   . Hypertension Other   . Other Neg Hx     Social History Social History   Tobacco Use  . Smoking status: Former Smoker    Years: 1.00    Types: Cigars    Last attempt to quit: 03/02/2012    Years since quitting: 5.9  . Smokeless tobacco: Never Used  Substance Use Topics  . Alcohol use: No    Comment: occasional  . Drug use: No     Allergies   Penicillins and Shellfish allergy   Review of Systems Review of Systems   Physical Exam Triage Vital Signs ED Triage Vitals  Enc Vitals Group     BP 02/07/18 1014 134/81     Pulse Rate 02/07/18 1014 73     Resp 02/07/18 1014 16     Temp 02/07/18 1014 98.3 F (36.8 C)     Temp Source 02/07/18 1014 Oral  SpO2 02/07/18 1014 99 %     Weight --      Height --      Head Circumference --      Peak Flow --      Pain Score 02/07/18 1022 3     Pain Loc --      Pain Edu? --      Excl. in GC? --    No data found.  Updated Vital Signs BP 134/81 (BP Location: Left Arm)   Pulse 73   Temp 98.3 F (36.8 C) (Oral)   Resp 16   SpO2 99%    Physical Exam  Constitutional: She is oriented to person, place, and time. She appears well-developed and well-nourished. No distress.  Cardiovascular: Normal rate, regular rhythm and normal heart sounds.  Pulmonary/Chest: Effort normal and breath sounds normal.  Abdominal: Soft. There is no tenderness. There is no rigidity, no rebound, no guarding and no CVA tenderness.  Genitourinary: Uterus normal. There is no rash or tenderness on the right labia. There is no rash or tenderness on the left labia. Cervix exhibits no motion tenderness. Right adnexum displays no tenderness. Left adnexum displays no tenderness. There is bleeding in the vagina.  Neurological: She is alert and oriented to person, place,  and time.  Skin: Skin is warm and dry.     UC Treatments / Results  Labs (all labs ordered are listed, but only abnormal results are displayed) Labs Reviewed  POCT URINALYSIS DIP (DEVICE) - Abnormal; Notable for the following components:      Result Value   Bilirubin Urine SMALL (*)    Hgb urine dipstick MODERATE (*)    All other components within normal limits  POCT PREGNANCY, URINE  CERVICOVAGINAL ANCILLARY ONLY    EKG None  Radiology No results found.  Procedures Procedures (including critical care time)  Medications Ordered in UC Medications - No data to display  Initial Impression / Assessment and Plan / UC Course  I have reviewed the triage vital signs and the nursing notes.  Pertinent labs & imaging results that were available during my care of the patient were reviewed by me and considered in my medical decision making (see chart for details).     Vaginal cytology pending at this time with treatment for BV initiated. Patient declines empiric gonorrhea/chlamydia treatment at this time. Will notify of any positive findings and if any changes to treatment are needed. Encouraged safe sex practices.  If symptoms worsen or do not improve in the next week to return to be seen or to follow up with PCP. Patient verbalized understanding and agreeable to plan.     Final Clinical Impressions(s) / UC Diagnoses   Final diagnoses:  Acute vaginitis  Screen for STD (sexually transmitted disease)     Discharge Instructions     We will start treatment for bacterial vaginosis for your symptoms.  Do not drink alcohol while taking this medicine.  Will notify you of any positive findings and if any changes to treatment are needed.  You may also monitor your results on MyChart.  Please withhold from intercourse for the next week. Please use condoms to prevent STD's.   If symptoms worsen or do not improve in the next week to return to be seen or to follow up with your PCP.       ED Prescriptions    Medication Sig Dispense Auth. Provider   metroNIDAZOLE (FLAGYL) 500 MG tablet Take 1 tablet (500 mg total) by  mouth 2 (two) times daily for 7 days. 14 tablet Georgetta HaberBurky, Fatumata Kashani B, NP     Controlled Substance Prescriptions  Controlled Substance Registry consulted? Not Applicable   Georgetta HaberBurky, Sharlie Shreffler B, NP 02/07/18 1042

## 2018-02-07 NOTE — Discharge Instructions (Addendum)
We will start treatment for bacterial vaginosis for your symptoms.  Do not drink alcohol while taking this medicine.  Will notify you of any positive findings and if any changes to treatment are needed.  You may also monitor your results on MyChart.  Please withhold from intercourse for the next week. Please use condoms to prevent STD's.   If symptoms worsen or do not improve in the next week to return to be seen or to follow up with your PCP.

## 2018-02-08 LAB — CERVICOVAGINAL ANCILLARY ONLY
Bacterial vaginitis: POSITIVE — AB
Candida vaginitis: NEGATIVE
Chlamydia: NEGATIVE
NEISSERIA GONORRHEA: NEGATIVE
Trichomonas: NEGATIVE

## 2018-02-09 ENCOUNTER — Telehealth (HOSPITAL_COMMUNITY): Payer: Self-pay

## 2018-02-09 NOTE — Telephone Encounter (Signed)
Bacterial Vaginosis test is positive.  Prescription for metronidazole was given at the urgent care visit. Pt contacted regarding results. Answered all questions. Verbalized understanding.   

## 2024-01-15 ENCOUNTER — Encounter: Payer: Self-pay | Admitting: Advanced Practice Midwife
